# Patient Record
Sex: Male | Born: 1995 | Race: Black or African American | Hispanic: No | Marital: Single | Smoking: Never smoker
Health system: Southern US, Community
[De-identification: ages and names within clinical notes are randomized; demographics above are authoritative.]

## PROBLEM LIST (undated history)

## (undated) DIAGNOSIS — Q6689 Other  specified congenital deformities of feet: Secondary | ICD-10-CM

## (undated) DIAGNOSIS — J3089 Other allergic rhinitis: Secondary | ICD-10-CM

## (undated) DIAGNOSIS — G4733 Obstructive sleep apnea (adult) (pediatric): Secondary | ICD-10-CM

## (undated) DIAGNOSIS — T7840XA Allergy, unspecified, initial encounter: Secondary | ICD-10-CM

## (undated) HISTORY — PX: ADENOIDECTOMY: SUR15

## (undated) HISTORY — DX: Other specified congenital deformities of feet: Q66.89

## (undated) HISTORY — DX: Allergy, unspecified, initial encounter: T78.40XA

## (undated) HISTORY — PX: TONSILLECTOMY: SUR1361

## (undated) HISTORY — DX: Other allergic rhinitis: J30.89

## (undated) HISTORY — DX: Obstructive sleep apnea (adult) (pediatric): G47.33

---
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
## 2015-06-14 ENCOUNTER — Encounter (HOSPITAL_COMMUNITY): Payer: Self-pay | Admitting: Emergency Medicine

## 2015-06-14 ENCOUNTER — Emergency Department (INDEPENDENT_AMBULATORY_CARE_PROVIDER_SITE_OTHER)
Admission: EM | Admit: 2015-06-14 | Discharge: 2015-06-14 | Disposition: A | Discharge status: Home / Self Care | Payer: BLUE CROSS/BLUE SHIELD | Source: Home / Self Care | Attending: Emergency Medicine | Admitting: Emergency Medicine

## 2015-06-14 DIAGNOSIS — M778 Other enthesopathies, not elsewhere classified: Secondary | ICD-10-CM | POA: Diagnosis not present

## 2015-06-14 MED ORDER — DICLOFENAC SODIUM 1 % TD GEL: 2.0000 g | Freq: Four times a day (QID) | TRANSDERMAL | Status: DC

## 2015-06-14 MED ORDER — IBUPROFEN 600 MG PO TABS: 600.0000 mg | ORAL_TABLET | Freq: Four times a day (QID) | ORAL | Status: DC | PRN

## 2015-06-14 NOTE — ED Notes (Signed)
C/o right hand pain onset Thursday Denies inj/trauma; reports he started to work at [employer] x3 weeks A&O x4... No acute distress.

## 2015-06-14 NOTE — ED Provider Notes (Signed)
CSN: [id number]     Arrival date & time 06/14/15  1302 History   First MD Initiated Contact with Patient 06/14/15 1327     Chief Complaint  Patient presents with  . Hand Pain   (Consider location/radiation/quality/duration/timing/severity/associated sxs/prior Treatment) HPI  He is a 19 year old man here for evaluation of right wrist pain. He states he has been having intermittent pain since the beginning of the year. In the last week, it has gotten worse. He reports the pain is in the volar aspect of the wrist. It is worse with wrist flexion. He also reports some discomfort with finger flexion. He states he had some swelling of the hand a few days ago. This has improved. He also reports an episode of numbness in the second and third digits. He did buy a brace on Friday. He states his mom and his sisters all have carpal tunnel. 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 He started this job approximately 4 weeks ago. He also reports some pain in the fifth metatarsal area. He states this is been ongoing since he broke that bone a year ago.  Past Medical History  Diagnosis Date  . Allergy   . Congenital hammer toe     Saw Dr. Lunette StandsAnna Voytek at age 42 yrs. Rec F/u after stops growing  . Perennial allergic rhinitis 12/27/2011    Nasal saline, cetirizine, Topical nasal steroid needed at times for control  . OSA (obstructive sleep apnea)     Rx T and A   Past Surgical History  Procedure Laterality Date  . Tonsillectomy    . Adenoidectomy     Family History  Problem Relation Age of Onset  . Hypertension Mother   . Asthma Mother   . Diabetes Father   . Hyperlipidemia Paternal Grandmother   . Diabetes Paternal Grandmother   . Hyperlipidemia Paternal Grandfather   . Diabetes Paternal Grandfather   . Hypertension Maternal Grandmother   . Hypertension Maternal Grandfather   . Kidney disease Maternal Grandmother     from hypertension  . Kidney disease Maternal Grandfather     from hypertension    Social History  Substance Use Topics  . Smoking status: Never Smoker   . Smokeless tobacco: Never Used  . Alcohol Use: No    Review of Systems As in history of present illness Allergies  Shellfish allergy  Home Medications   Prior to Admission medications   Medication Sig Start Date End Date Taking? Authorizing Provider  diclofenac sodium (VOLTAREN) 1 % GEL Apply 2 g topically 4 (four) times daily. 06/14/15   Charm RingsErin J Honig, MD  EPINEPHrine (EPIPEN) 0.3 mg/0.3 mL DEVI Inject 0.3 mLs (0.3 mg total) into the muscle once. 03/27/12   Preston FleetingJames B Hooker, MD  ibuprofen (ADVIL,MOTRIN) 600 MG tablet Take 1 tablet (600 mg total) by mouth every 6 (six) hours as needed for moderate pain. 06/14/15   Charm RingsErin J Honig, MD   Meds Ordered and Administered this Visit  Medications - No data to display  BP 127/82 mmHg  Pulse 73  Temp(Src) 98.8 F (37.1 C) (Oral)  Resp 16  SpO2 97% No data found.   Physical Exam  Constitutional: He is oriented to person, place, and time. He appears well-developed and well-nourished. No distress.  Cardiovascular: Normal rate.   Pulmonary/Chest: Effort normal.  Musculoskeletal:  Right wrist: No erythema or edema. He has full active range of motion, but with some pain. He is tender over the flexor tendons. He has 5  out of 5 strength. Tinel's causes some pain that moves proximately.  Neurological: He is alert and oriented to person, place, and time.    ED Course  Procedures (including critical care time)  Labs Review Labs Reviewed - No data to display  Imaging Review No results found.   MDM   1. Right wrist tendonitis    He may also have some early carpal tunnel with the numbness in his second and third digits. Recommended continued use of the wrist brace. Voltaren Gel and ibuprofen 4 times a day. Also recommended icing. If not improving in the next 2 weeks, he will follow-up with his orthopedic doctor at [facility].     Charm Rings, MD 06/14/15  1356

## 2015-06-14 NOTE — Discharge Instructions (Signed)
You have tendinitis of your wrist. You may be developing some early carpal tunnel as well. Please wear the brace you bought as often as you can, but particularly at work. 4 times a day: remove the brace, do gentle range of motion, apply ice for 20 minutes, then apply Voltaren gel. You should see improvement over the next 1-2 weeks. [facility] if not improving after 2 weeks.

## 2015-09-11 ENCOUNTER — Encounter (HOSPITAL_COMMUNITY): Payer: Self-pay | Admitting: *Deleted

## 2015-09-11 ENCOUNTER — Emergency Department (INDEPENDENT_AMBULATORY_CARE_PROVIDER_SITE_OTHER)
Admission: EM | Admit: 2015-09-11 | Discharge: 2015-09-11 | Disposition: A | Discharge status: Home / Self Care | Payer: Managed Care, Other (non HMO) | Source: Home / Self Care | Attending: Internal Medicine | Admitting: Internal Medicine

## 2015-09-11 DIAGNOSIS — M778 Other enthesopathies, not elsewhere classified: Secondary | ICD-10-CM

## 2015-09-11 MED ORDER — DICLOFENAC SODIUM 1 % TD GEL
2.0000 g | Freq: Four times a day (QID) | TRANSDERMAL | Status: DC
Start: 2015-09-11 — End: 2016-11-25

## 2015-09-11 MED ORDER — IBUPROFEN 600 MG PO TABS: 600.0000 mg | ORAL_TABLET | Freq: Four times a day (QID) | ORAL | Status: DC | PRN

## 2015-09-11 NOTE — Discharge Instructions (Signed)
Prescriptions for ibuprofen and voltaren (diclofenac) gel were sent to [facility] and [facility]. Ice may be helpful.  If symptoms do not settle down quickly (in a week or so), would consider splinting but do not believe this is necessary at this time. Recheck or followup with pcp/Dr Ramgoolan, for persistent symptoms.

## 2015-09-11 NOTE — ED Provider Notes (Addendum)
CSN: [id number]     Arrival date & time 09/11/15  1646 History   First MD Initiated Contact with Patient 09/11/15 1743     Chief Complaint  Patient presents with  . Wrist Pain   HPI   Patient is a 20 year old gentleman who was seen at the urgent care in November 2016, with right wrist tendinitis. He was treated with ibuprofen and topical Voltaren, and improved. Last evening, he lifted some boxes, and is now having some pain with extension of his wrist. He is worried that symptoms will flare up and be persistent as they were before. No bruising, no report of trauma. No swelling. He has a soft wrist splint at home.  Past Medical History  Diagnosis Date  . Allergy   . Congenital hammer toe     Saw Dr. Lunette Stands at age 63 yrs. Rec F/u after stops growing  . Perennial allergic rhinitis 12/27/2011    Nasal saline, cetirizine, Topical nasal steroid needed at times for control  . OSA (obstructive sleep apnea)     Rx T and A   Past Surgical History  Procedure Laterality Date  . Tonsillectomy    . Adenoidectomy     Family History  Problem Relation Age of Onset  . Hypertension Mother   . Asthma Mother   . Diabetes Father   . Hyperlipidemia Paternal Grandmother   . Diabetes Paternal Grandmother   . Hyperlipidemia Paternal Grandfather   . Diabetes Paternal Grandfather   . Hypertension Maternal Grandmother   . Hypertension Maternal Grandfather   . Kidney disease Maternal Grandmother     from hypertension  . Kidney disease Maternal Grandfather     from hypertension   Social History  Substance Use Topics  . Smoking status: Never Smoker   . Smokeless tobacco: Never Used  . Alcohol Use: No    Review of Systems  All other systems reviewed and are negative.   Allergies  Shellfish allergy  Home Medications   Prior to Admission medications   Medication Sig Start Date End Date Taking? Authorizing Provider  diclofenac sodium (VOLTAREN) 1 % GEL Apply 2 g topically 4 (four) times  daily. 09/11/15   Eustace Moore, MD  EPINEPHrine (EPIPEN) 0.3 mg/0.3 mL DEVI Inject 0.3 mLs (0.3 mg total) into the muscle once. 03/27/12   Preston Fleeting, MD  ibuprofen (ADVIL,MOTRIN) 600 MG tablet Take 1 tablet (600 mg total) by mouth every 6 (six) hours as needed. 09/11/15   Eustace Moore, MD      BP 152/80 mmHg  Pulse 64  Temp(Src) 97.9 F (36.6 C) (Oral)  SpO2 98%   Physical Exam  Constitutional: He is oriented to person, place, and time. No distress.  Alert, nicely groomed  HENT:  Head: Atraumatic.  Eyes:  Conjugate gaze, no eye redness/drainage  Neck: Neck supple.  Cardiovascular: Normal rate.   Pulmonary/Chest: No respiratory distress.  Abdominal: He exhibits no distension.  Musculoskeletal: Normal range of motion.  Fairly symmetric wrist motion, minimal restriction of right wrist extension compared to left. No focal tenderness, no swelling. No bruising/warmth/erythema. Pain with right wrist extension. Able to make a tight fist.  Neurological: He is alert and oriented to person, place, and time.  Skin: Skin is warm and dry.  No cyanosis  Nursing note and vitals reviewed.   ED Course  Procedures (including critical care time)  Cockup splint applied  MDM   1. Tendinitis of right wrist    Meds ordered this  encounter  Medications  . ibuprofen (ADVIL,MOTRIN) 600 MG tablet    Sig: Take 1 tablet (600 mg total) by mouth every 6 (six) hours as needed.    Dispense:  30 tablet    Refill:  0  . diclofenac sodium (VOLTAREN) 1 % GEL    Sig: Apply 2 g topically 4 (four) times daily.    Dispense:  1 Tube    Refill:  0   Apply ice if the wrist is achy at the end of the day. If symptoms are not improving rapidly, splint during activities of the day. Recheck or follow up with PCP/Dr. Ramgoolam for persistent symptoms.      Eustace Moore, MD 09/11/15 1820  Eustace Moore, MD 09/13/15 [id number]

## 2015-09-11 NOTE — ED Notes (Signed)
Pt  Reports  r   Wrist  Pain         Several  Months  Ago     Was  Treated     For  Poss  Tendonitis           And      Reports      He   May    Have  reinjured   It      Yesterday     he  Reports  Pain on  rom

## 2015-10-07 ENCOUNTER — Emergency Department (INDEPENDENT_AMBULATORY_CARE_PROVIDER_SITE_OTHER): Payer: Managed Care, Other (non HMO)

## 2015-10-07 ENCOUNTER — Encounter (HOSPITAL_COMMUNITY): Payer: Self-pay | Admitting: Emergency Medicine

## 2015-10-07 ENCOUNTER — Emergency Department (INDEPENDENT_AMBULATORY_CARE_PROVIDER_SITE_OTHER)
Admission: EM | Admit: 2015-10-07 | Discharge: 2015-10-07 | Disposition: A | Discharge status: Home / Self Care | Payer: Managed Care, Other (non HMO) | Source: Home / Self Care | Attending: Emergency Medicine | Admitting: Emergency Medicine

## 2015-10-07 DIAGNOSIS — S46912A Strain of unspecified muscle, fascia and tendon at shoulder and upper arm level, left arm, initial encounter: Secondary | ICD-10-CM

## 2015-10-07 NOTE — Discharge Instructions (Signed)
Muscle Strain Wear the shoulder sling for approximately 2 days. Do not wear it any longer. Apply ice to the areas of soreness of the shoulder for 2 days than apply heat. Slowly start to move the muscles of the shoulder in 2-3 days. No heavy lifting or pulling for one week. Follow-up with your doctor if pain persists or continued to have other problems. A muscle strain is an injury that occurs when a muscle is stretched beyond its normal length. Usually a small number of muscle fibers are torn when this happens. Muscle strain is rated in degrees. First-degree strains have the least amount of muscle fiber tearing and pain. Second-degree and third-degree strains have increasingly more tearing and pain.  Usually, recovery from muscle strain takes 1-2 weeks. Complete healing takes 5-6 weeks.  CAUSES  Muscle strain happens when a sudden, violent force placed on a muscle stretches it too far. This may occur with lifting, sports, or a fall.  RISK FACTORS Muscle strain is especially common in athletes.  SIGNS AND SYMPTOMS At the site of the muscle strain, there may be:  Pain.  Bruising.  Swelling.  Difficulty using the muscle due to pain or lack of normal function. DIAGNOSIS  Your health care provider will perform a physical exam and ask about your medical history. TREATMENT  Often, the best treatment for a muscle strain is resting, icing, and applying cold compresses to the injured area.  HOME CARE INSTRUCTIONS   Use the PRICE method of treatment to promote muscle healing during the first 2-3 days after your injury. The PRICE method involves:  Protecting the muscle from being injured again.  Restricting your activity and resting the injured body part.  Icing your injury. To do this, put ice in a plastic bag. Place a towel between your skin and the bag. Then, apply the ice and leave it on from 15-20 minutes each hour. After the third day, switch to moist heat packs.  Apply compression to the  injured area with a splint or elastic bandage. Be careful not to wrap it too tightly. This may interfere with blood circulation or increase swelling.  Elevate the injured body part above the level of your heart as often as you can.  Only take over-the-counter or prescription medicines for pain, discomfort, or fever as directed by your health care provider.  Warming up prior to exercise helps to prevent future muscle strains. SEEK MEDICAL CARE IF:   You have increasing pain or swelling in the injured area.  You have numbness, tingling, or a significant loss of strength in the injured area. MAKE SURE YOU:   Understand these instructions.  Will watch your condition.  Will get help right away if you are not doing well or get worse.   This information is not intended to replace advice given to you by your health care provider. Make sure you discuss any questions you have with your health care provider.   Document Released: 07/04/2005 Document Revised: 04/24/2013 Document Reviewed: 01/31/2013 Elsevier Interactive Patient Education [employer]2016 [employer] Inc[employer]

## 2015-10-07 NOTE — ED Provider Notes (Signed)
CSN: [id number]     Arrival date & time 10/07/15  1826 History   First MD Initiated Contact with Patient 10/07/15 2012     Chief Complaint  Patient presents with  . 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 He denies any known injury, fall or blunt trauma. Denies feeling or hearing popping or other abnormal sounds. He states that his shoulder just started to hurt. This occurred yesterday. The soreness increased over the next several hours. He points to the upper left anterior chest/pectoralis muscle as it crosses from the chest to the humerus as well as the trapezius muscle and the left posterior shoulder.  Patient is a 20 y.o. male presenting with shoulder injury.  Shoulder Injury    Past Medical History  Diagnosis Date  . Allergy   . Congenital hammer toe     Saw Dr. Lunette Stands at age 16 yrs. Rec F/u after stops growing  . Perennial allergic rhinitis 12/27/2011    Nasal saline, cetirizine, Topical nasal steroid needed at times for control  . OSA (obstructive sleep apnea)     Rx T and A   Past Surgical History  Procedure Laterality Date  . Tonsillectomy    . Adenoidectomy     Family History  Problem Relation Age of Onset  . Hypertension Mother   . Asthma Mother   . Diabetes Father   . Hyperlipidemia Paternal Grandmother   . Diabetes Paternal Grandmother   . Hyperlipidemia Paternal Grandfather   . Diabetes Paternal Grandfather   . Hypertension Maternal Grandmother   . Hypertension Maternal Grandfather   . Kidney disease Maternal Grandmother     from hypertension  . Kidney disease Maternal Grandfather     from hypertension   Social History  Substance Use Topics  . Smoking status: Never Smoker   . Smokeless tobacco: Never Used  . Alcohol Use: No    Review of Systems   Constitutional: Negative.   Respiratory: Negative.   Musculoskeletal: Positive for myalgias. Negative for back pain, joint swelling, neck pain and neck stiffness.  Skin: Negative.   Neurological: Negative.  Negative for numbness.  All other systems reviewed and are negative.   Allergies  Shellfish allergy  Home Medications   Prior to Admission medications   Medication Sig Start Date End Date Taking? Authorizing Provider  diclofenac sodium (VOLTAREN) 1 % GEL Apply 2 g topically 4 (four) times daily. 09/11/15   Eustace Moore, MD  EPINEPHrine (EPIPEN) 0.3 mg/0.3 mL DEVI Inject 0.3 mLs (0.3 mg total) into the muscle once. 03/27/12   Preston Fleeting, MD  ibuprofen (ADVIL,MOTRIN) 600 MG tablet Take 1 tablet (600 mg total) by mouth every 6 (six) hours as needed. 09/11/15   Eustace Moore, MD   Meds Ordered and Administered this Visit  Medications - No data to display  BP 129/91 mmHg  Pulse 74  Temp(Src) 98.3 F (36.8 C) (Oral)  Resp 20  SpO2 98% No data found.   Physical Exam  Constitutional: He is oriented to person, place, and time. He appears well-developed and well-nourished. No distress.  Neck: Normal range of motion. Neck supple.  Cardiovascular: Normal rate.   Pulmonary/Chest: Effort normal. No respiratory distress.  Musculoskeletal: He exhibits tenderness. He exhibits no edema.  Tenderness over the left upper pectoralis muscle and tendon has a crosses to the humerus. Tenderness over the ridge  of the trapezius muscle, tenderness to the posterior shoulder musculature involving the infraspinatus and teres muscles. Patient is able to abduct 200. Beyond that pain in the posterior shoulder increases. Passive abduction to nearly 180. No swelling, deformity or discoloration. There is posterior joint line swelling over the posterior shoulder musculature as mentioned above. Internal rotation and external rotation produces minor tenderness in the posterior shoulder. Distal neurovascular  motor sensory is intact.  Neurological: He is alert and oriented to person, place, and time. He exhibits normal muscle tone.  Skin: Skin is warm and dry.  Psychiatric: He has a normal mood and affect.  Nursing note and vitals reviewed.   ED Course  Procedures (including critical care time)  Labs Review Labs Reviewed - No data to display  Imaging Review Dg Shoulder Left  10/07/2015  CLINICAL DATA:  Pain in left shoulder after picking up heavy bags. EXAM: LEFT SHOULDER - 2+ VIEW COMPARISON:  None. FINDINGS: There is no evidence of fracture or dislocation. There is no evidence of arthropathy or other focal bone abnormality. Soft tissues are unremarkable. Acromioclavicular and coracoclavicular spaces are maintained. IMPRESSION: No acute bony abnormalities. No evidence of acromioclavicular joint separation. Electronically Signed   By: Burman NievesWilliam  Stevens M.D.   On: 10/07/2015 20:40     Visual Acuity Review  Right Eye Distance:   Left Eye Distance:   Bilateral Distance:    Right Eye Near:   Left Eye Near:    Bilateral Near:         MDM   1. Shoulder strain, left, initial encounter   2. Muscle strain, shoulder region, left, initial encounter    Wear the shoulder sling for approximately 2 days. Do not wear it any longer. Apply ice to the areas of soreness of the shoulder for 2 days than apply heat. Slowly start to move the muscles of the shoulder in 2-3 days. No heavy lifting or pulling for one week. Follow-up with your doctor if pain persists or continued to have other problems. motrn for pain prn    Hayden Rasmussenavid Kiani Wurtzel, NP 10/07/15 2054

## 2015-10-07 NOTE — ED Notes (Signed)
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 These bags fell on a coworker and PT rushed over to remove bags. PT reports he didn't notice at the time, but afterward, he had pain in his left shoulder. PT's ROM is now decreased and PT reports pain when moving affected extremity. This happened yesterday.

## 2016-09-12 DIAGNOSIS — M79641 Pain in right hand: Secondary | ICD-10-CM | POA: Insufficient documentation

## 2016-09-12 DIAGNOSIS — G5621 Lesion of ulnar nerve, right upper limb: Secondary | ICD-10-CM | POA: Insufficient documentation

## 2016-09-12 DIAGNOSIS — S62306P Unspecified fracture of fifth metacarpal bone, right hand, subsequent encounter for fracture with malunion: Secondary | ICD-10-CM | POA: Insufficient documentation

## 2016-09-12 HISTORY — DX: Pain in right hand: M79.641

## 2016-09-12 HISTORY — DX: Lesion of ulnar nerve, right upper limb: G56.21

## 2016-09-12 HISTORY — DX: Unspecified fracture of fifth metacarpal bone, right hand, subsequent encounter for fracture with malunion: S62.306P

## 2016-11-25 ENCOUNTER — Ambulatory Visit (HOSPITAL_COMMUNITY)
Admission: EM | Admit: 2016-11-25 | Discharge: 2016-11-25 | Disposition: A | Discharge status: Home / Self Care | Payer: BLUE CROSS/BLUE SHIELD | Attending: Internal Medicine | Admitting: Internal Medicine

## 2016-11-25 ENCOUNTER — Encounter (HOSPITAL_COMMUNITY): Payer: Self-pay | Admitting: Emergency Medicine

## 2016-11-25 DIAGNOSIS — S39012A Strain of muscle, fascia and tendon of lower back, initial encounter: Secondary | ICD-10-CM | POA: Diagnosis not present

## 2016-11-25 DIAGNOSIS — S29019A Strain of muscle and tendon of unspecified wall of thorax, initial encounter: Secondary | ICD-10-CM

## 2016-11-25 DIAGNOSIS — J301 Allergic rhinitis due to pollen: Secondary | ICD-10-CM

## 2016-11-25 MED ORDER — PREDNISONE 20 MG PO TABS: ORAL_TABLET | ORAL | 0 refills | Status: DC

## 2016-11-25 MED ORDER — MONTELUKAST SODIUM 10 MG PO TABS: 10.0000 mg | ORAL_TABLET | Freq: Every day | ORAL | 0 refills | Status: DC

## 2016-11-25 NOTE — ED Triage Notes (Addendum)
[facility] with a complaint of back pain x 2 weeks. The patient reported that his back started hurting after doing PT in the [employer]. The patient also requested STD testing however has no symptoms. The patient also complained of allergy symptoms that were not being controlled with OTC meds.

## 2016-11-25 NOTE — ED Provider Notes (Signed)
CSN: [id number]     Arrival date & time 11/25/16  1245 History   First MD Initiated Contact with Patient 11/25/16 1458     Chief Complaint  Patient presents with  . Back Pain  . SEXUALLY TRANSMITTED DISEASE  . 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 He said during that time while doing pushups, sit ups and running he developed pain in the right parathoracic musculature and left paralumbar musculature. This was about a week ago. He states mostly stiff and hurts at work. His work is [employer]. He has no radiating pain or radiculopathy. No numbness or focal weakness.  Second complaint is that of allergies. He states he is taking Zyrtec and another OTC medication with Flonase and still having stuffy nose, runny nose and PND sinus congestion. No fevers or chills  States he is here for his monthly check for STDs. He is asymptomatic.      Past Medical History:  Diagnosis Date  . Allergy   . Congenital hammer toe    Saw Dr. Lunette Stands at age 74 yrs. Rec F/u after stops growing  . OSA (obstructive sleep apnea)    Rx T and A  . Perennial allergic rhinitis 12/27/2011   Nasal saline, cetirizine, Topical nasal steroid needed at times for control   Past Surgical History:  Procedure Laterality Date  . ADENOIDECTOMY    . TONSILLECTOMY     Family History  Problem Relation Age of Onset  . Hypertension Mother   . Asthma Mother   . Diabetes Father   . Hyperlipidemia Paternal Grandmother   . Diabetes Paternal Grandmother   . Hyperlipidemia Paternal Grandfather   . Diabetes Paternal Grandfather   . Hypertension Maternal Grandmother   . Hypertension Maternal Grandfather   . Kidney disease Maternal Grandmother        from hypertension  . Kidney disease Maternal Grandfather        from hypertension   Social History  Substance Use Topics  . Smoking status: Never  Smoker  . Smokeless tobacco: Never Used  . Alcohol use No    Review of Systems  Constitutional: Positive for activity change. Negative for diaphoresis, fatigue and fever.  HENT: Positive for congestion, postnasal drip, rhinorrhea and sinus pressure. Negative for ear pain and sore throat.   Eyes: Negative.   Respiratory: Negative.  Negative for cough, chest tightness, shortness of breath and wheezing.   Cardiovascular: Negative for chest pain, palpitations and leg swelling.  Gastrointestinal: Negative.   Genitourinary: Negative.  Negative for dysuria, frequency, penile pain, scrotal swelling and testicular pain.  Musculoskeletal: Positive for back pain and myalgias. Negative for neck pain and neck stiffness.       As per HPI  Skin: Negative.  Negative for color change and rash.  Neurological: Negative.  Negative for dizziness, weakness, numbness and headaches.  Psychiatric/Behavioral: Negative.  Negative for behavioral problems.  All other systems reviewed and are negative.   Allergies  Shellfish allergy  Home Medications   Prior to Admission medications   Medication Sig Start Date End Date Taking? Authorizing Provider  loratadine (CLARITIN) 10 MG tablet Take 10 mg by mouth daily.   Yes [provider]  montelukast (SINGULAIR) 10 MG tablet Take 1 tablet (10 mg total) by mouth at bedtime. 11/25/16   Hayden Rasmussen, NP  predniSONE (DELTASONE) 20 MG tablet Take 3 tabs po on first day, 2 tabs second day, 2 tabs  third day, 1 tab fourth day, 1 tab 5th day. Take with food. 11/25/16   Hayden RasmussenMabe, Ladina Shutters, NP   Meds Ordered and Administered this Visit  Medications - No data to display  BP 133/80 (BP Location: Right Arm)   Pulse 72   Temp 98.4 F (36.9 C) (Oral)   Resp 18   SpO2 99%  No data found.   Physical Exam  Constitutional: He is oriented to person, place, and time. He appears well-developed and well-nourished.  HENT:  Head: Normocephalic and atraumatic.  Mouth/Throat: No  oropharyngeal exudate.  Oropharynx with PND.  Eyes: EOM are normal. Left eye exhibits no discharge.  Neck: Normal range of motion. Neck supple.  Cardiovascular: Normal heart sounds.   Pulmonary/Chest: Effort normal. No respiratory distress.  Musculoskeletal: Normal range of motion. He exhibits no edema or deformity.  No spinal tenderness, deformity, step-off deformity. Tenderness to the right parathoracic musculature and tenderness to the left paralumbar musculature. Patient is able to quickly bend forward well beyond 90 with no pain complaining only of stiffness. He is able to return to standing position with usual amount of effort. Strength and lower extremities 5 over 5 and equal.  Lymphadenopathy:    He has no cervical adenopathy.  Neurological: He is alert and oriented to person, place, and time. No cranial nerve deficit. Coordination normal.  Skin: Skin is warm and dry.  Psychiatric: He has a normal mood and affect. His behavior is normal.  Nursing note and vitals reviewed.   Urgent Care Course     Procedures (including critical care time)  Labs Review Labs Reviewed - No data to display  Imaging Review No results found.   Visual Acuity Review  Right Eye Distance:   Left Eye Distance:   Bilateral Distance:    Right Eye Near:   Left Eye Near:    Bilateral Near:         MDM   1. Strain of lumbar region, initial encounter   2. Thoracic myofascial strain, initial encounter   3. Seasonal allergic rhinitis due to pollen    Apply heat to the area pain in her back. Perform stretches as demonstrated. Limit activities that exacerbate the pain. For worsening, new symptoms or problems follow-up with primary care doctor or may return. Follow-up with health Department for routine STD screening. The lower medications listed that you can take for allergies in addition to the prescription you received. Sudafed PE 10 mg every 4 to 6 hours as needed for congestion Allegra or  Zyrtec daily as needed for drainage and runny nose. For stronger antihistamine may take Chlor-Trimeton 2 to 4 mg every 4 to 6 hours, may cause drowsiness. Saline nasal spray used frequently. Ibuprofen 600 mg every 6 hours as needed for pain, discomfort or fever. Drink plenty of fluids and stay well-hydrated. Flonase or Rhinocort nasal spray daily Meds ordered this encounter  Medications  . loratadine (CLARITIN) 10 MG tablet    Sig: Take 10 mg by mouth daily.  . predniSONE (DELTASONE) 20 MG tablet    Sig: Take 3 tabs po on first day, 2 tabs second day, 2 tabs third day, 1 tab fourth day, 1 tab 5th day. Take with food.    Dispense:  9 tablet    Refill:  0    Order Specific Question:   Supervising Provider    Answer:   Eustace MooreMURRAY, LAURA W [[id number]]  . montelukast (SINGULAIR) 10 MG tablet    Sig: Take 1 tablet (10 mg  total) by mouth at bedtime.    Dispense:  30 tablet    Refill:  0    Order Specific Question:   Supervising Provider    Answer:   Eustace Moore [[id number]]       Hayden Rasmussen, NP 11/25/16 1523

## 2016-11-25 NOTE — Discharge Instructions (Signed)
Apply heat to the area pain in her back. Perform stretches as demonstrated. Limit activities that exacerbate the pain. For worsening, new symptoms or problems follow-up with primary care doctor or may return. Follow-up with health Department for routine STD screening. The lower medications listed that you can take for allergies in addition to the prescription you received. Sudafed PE 10 mg every 4 to 6 hours as needed for congestion Allegra or Zyrtec daily as needed for drainage and runny nose. For stronger antihistamine may take Chlor-Trimeton 2 to 4 mg every 4 to 6 hours, may cause drowsiness. Saline nasal spray used frequently. Ibuprofen 600 mg every 6 hours as needed for pain, discomfort or fever. Drink plenty of fluids and stay well-hydrated. Flonase or Rhinocort nasal spray daily

## 2016-11-27 ENCOUNTER — Encounter (HOSPITAL_COMMUNITY): Payer: Self-pay | Admitting: Emergency Medicine

## 2016-11-27 ENCOUNTER — Emergency Department (HOSPITAL_COMMUNITY)
Admission: EM | Admit: 2016-11-27 | Discharge: 2016-11-27 | Disposition: A | Discharge status: Home / Self Care | Payer: BLUE CROSS/BLUE SHIELD | Attending: Emergency Medicine | Admitting: Emergency Medicine

## 2016-11-27 DIAGNOSIS — R112 Nausea with vomiting, unspecified: Secondary | ICD-10-CM | POA: Diagnosis present

## 2016-11-27 DIAGNOSIS — R197 Diarrhea, unspecified: Secondary | ICD-10-CM | POA: Insufficient documentation

## 2016-11-27 LAB — COMPREHENSIVE METABOLIC PANEL
ALK PHOS: 71 U/L (ref 38–126)
ALT: 20 U/L (ref 17–63)
AST: 21 U/L (ref 15–41)
Albumin: 4.3 g/dL (ref 3.5–5.0)
Anion gap: 8 (ref 5–15)
BUN: 9 mg/dL (ref 6–20)
CALCIUM: 9.3 mg/dL (ref 8.9–10.3)
CO2: 26 mmol/L (ref 22–32)
CREATININE: 1.04 mg/dL (ref 0.61–1.24)
Chloride: 102 mmol/L (ref 101–111)
Glucose, Bld: 105 mg/dL — ABNORMAL HIGH (ref 65–99)
Potassium: 3.5 mmol/L (ref 3.5–5.1)
Sodium: 136 mmol/L (ref 135–145)
Total Bilirubin: 0.8 mg/dL (ref 0.3–1.2)
Total Protein: 7.5 g/dL (ref 6.5–8.1)

## 2016-11-27 LAB — LIPASE, BLOOD: Lipase: 17 U/L (ref 11–51)

## 2016-11-27 LAB — CBC
HCT: 43.6 % (ref 39.0–52.0)
Hemoglobin: 14.9 g/dL (ref 13.0–17.0)
MCH: 29.7 pg (ref 26.0–34.0)
MCHC: 34.2 g/dL (ref 30.0–36.0)
MCV: 87 fL (ref 78.0–100.0)
PLATELETS: 261 10*3/uL (ref 150–400)
RBC: 5.01 MIL/uL (ref 4.22–5.81)
RDW: 12.8 % (ref 11.5–15.5)
WBC: 7.2 10*3/uL (ref 4.0–10.5)

## 2016-11-27 LAB — URINALYSIS, ROUTINE W REFLEX MICROSCOPIC
Bilirubin Urine: NEGATIVE
Glucose, UA: NEGATIVE mg/dL
HGB URINE DIPSTICK: NEGATIVE
Ketones, ur: NEGATIVE mg/dL
LEUKOCYTES UA: NEGATIVE
Nitrite: NEGATIVE
PROTEIN: NEGATIVE mg/dL
Specific Gravity, Urine: 1.026 (ref 1.005–1.030)
pH: 5 (ref 5.0–8.0)

## 2016-11-27 MED ORDER — ONDANSETRON HCL 4 MG PO TABS: 4.0000 mg | ORAL_TABLET | Freq: Four times a day (QID) | ORAL | 0 refills | Status: DC | PRN

## 2016-11-27 MED ORDER — ONDANSETRON 4 MG PO TBDP
ORAL_TABLET | ORAL | Status: AC
  Filled 2016-11-27: qty 1

## 2016-11-27 MED ORDER — ONDANSETRON 4 MG PO TBDP
4.0000 mg | ORAL_TABLET | Freq: Once | ORAL | Status: AC | PRN
  Administered 2016-11-27: 4 mg via ORAL

## 2016-11-27 NOTE — ED Provider Notes (Signed)
[facility]Y DEPT Provider Note   CSN: [id number] Arrival date & time: 11/27/16  0446     History   Chief Complaint Chief Complaint  Patient presents with  . Emesis  . Nausea  . Diarrhea    HPI Joe [name] is a 21 y.o. male.  The history is provided by the patient.  Emesis   This is a new problem. The current episode started 6 to 12 hours ago. The problem occurs 2 to 4 times per day. The problem has not changed since onset.The emesis has an appearance of stomach contents. There has been no fever. Associated symptoms include diarrhea. Pertinent negatives include no cough, no fever and no URI. Risk factors include ill contacts.  Diarrhea   Associated symptoms include vomiting. Pertinent negatives include no URI and no cough.   21 year old male who presents with nausea, vomiting, diarrhea. Onset this morning at midnight. 2 episodes of nonbloody nonbilious vomiting with 2 episodes of nonbloody diarrhea. No abdominal pain just intermittent cramping before vomiting or diarrhea. No dysuria or frequency. No fever or chills. No cough or URI symptoms. No suspiciou food intake. His girlfriend sick with similar symptoms.  Past Medical History:  Diagnosis Date  . Allergy   . Congenital hammer toe    Saw Dr. Lunette Stands at age 60 yrs. Rec F/u after stops growing  . OSA (obstructive sleep apnea)    Rx T and A  . Perennial allergic rhinitis 12/27/2011   Nasal saline, cetirizine, Topical nasal steroid needed at times for control    Patient Active Problem List   Diagnosis Date Noted  . BMI 28.0-28.9,adult 01/01/2014  . Keratosis pilaris 12/28/2011  . Perennial allergic rhinitis 12/27/2011  . Congenital hammer toe   . Allergy to shellfish 08/17/2011    Past Surgical History:  Procedure Laterality Date  . ADENOIDECTOMY    . TONSILLECTOMY         Home Medications    Prior to Admission medications   Medication Sig Start Date End Date Taking? Authorizing Provider    loratadine (CLARITIN) 10 MG tablet Take 10 mg by mouth daily.    [provider]  montelukast (SINGULAIR) 10 MG tablet Take 1 tablet (10 mg total) by mouth at bedtime. 11/25/16   Hayden Rasmussen, NP  predniSONE (DELTASONE) 20 MG tablet Take 3 tabs po on first day, 2 tabs second day, 2 tabs third day, 1 tab fourth day, 1 tab 5th day. Take with food. 11/25/16   Hayden Rasmussen, NP    Family History Family History  Problem Relation Age of Onset  . Hypertension Mother   . Asthma Mother   . Diabetes Father   . Hyperlipidemia Paternal Grandmother   . Diabetes Paternal Grandmother   . Hyperlipidemia Paternal Grandfather   . Diabetes Paternal Grandfather   . Hypertension Maternal Grandmother   . Kidney disease Maternal Grandmother        from hypertension  . Hypertension Maternal Grandfather   . Kidney disease Maternal Grandfather        from hypertension    Social History Social History  Substance Use Topics  . Smoking status: Never Smoker  . Smokeless tobacco: Never Used  . Alcohol use No     Allergies   Shellfish allergy   Review of Systems Review of Systems  Constitutional: Negative for fever.  HENT: Negative for congestion.   Respiratory: Negative for cough.   Cardiovascular: Negative for chest pain.  Gastrointestinal: Positive for diarrhea and  vomiting.  Genitourinary: Negative for dysuria.  Allergic/Immunologic: Negative for immunocompromised state.  Hematological: Does not bruise/bleed easily.  All other systems reviewed and are negative.    Physical Exam Updated Vital Signs BP 135/62 (BP Location: Left Arm)   Pulse 76   Temp 98.1 F (36.7 C) (Oral)   Resp 18   Ht 6\' 3"  (1.905 m)   Wt 250 lb (113.4 kg)   SpO2 96%   BMI 31.25 kg/m   Physical Exam Physical Exam  Nursing note and vitals reviewed. Constitutional:  Non-toxic, and in no acute distress Head: Normocephalic and atraumatic.  Mouth/Throat: Oropharynx is clear and moist.  Neck: Normal range  of motion. Neck supple.  Cardiovascular: Normal rate and regular rhythm.   Pulmonary/Chest: Effort normal and breath sounds normal.  Abdominal: Soft. There is no tenderness. There is no rebound and no guarding.  Musculoskeletal: Normal range of motion.  Neurological: Alert, no facial droop, fluent speech, moves all extremities symmetrically Skin: Skin is warm and dry.  Psychiatric: Cooperative   ED Treatments / Results  Labs (all labs ordered are listed, but only abnormal results are displayed) Labs Reviewed  COMPREHENSIVE METABOLIC PANEL - Abnormal; Notable for the following:       Result Value   Glucose, Bld 105 (*)    All other components within normal limits  LIPASE, BLOOD  CBC  URINALYSIS, ROUTINE W REFLEX MICROSCOPIC    EKG  EKG Interpretation None       Radiology No results found.  Procedures Procedures (including critical care time)  Medications Ordered in ED Medications  ondansetron (ZOFRAN-ODT) 4 MG disintegrating tablet (not administered)  ondansetron (ZOFRAN-ODT) disintegrating tablet 4 mg (4 mg Oral Given 11/27/16 0500)     Initial Impression / Assessment and Plan / ED Course  I have reviewed the triage vital signs and the nursing notes.  Pertinent labs & imaging results that were available during my care of the patient were reviewed by me and considered in my medical decision making (see chart for details).     Presenting with 1 day of nausea, vomiting, and diarrhea. Presentation seems consistent with that of a benign GI illness, such as food borne illness versus gastroenteritis. He is normal vital signs. Has a soft and benign abdomen. Blood work reviewed and shows no major electrolyte or metabolic derangements. Low suspicion for serious intra-abdominal pathology. I discussed continued supportive care management for this. With Zofran he has been able to tolerate by mouth intake without difficulty. We'll discharge home with a course of Zofran. Strict  return and follow-up instructions reviewed. He expressed understanding of all discharge instructions and felt comfortable with the plan of care.   Final Clinical Impressions(s) / ED Diagnoses   Final diagnoses:  Nausea vomiting and diarrhea    New Prescriptions New Prescriptions   No medications on file     Lavera GuiseLiu, Sabree Nuon Duo, MD 11/27/16 [phone]

## 2016-11-27 NOTE — Discharge Instructions (Signed)
Take nausea medications as prescribed.  Return for worsening symptoms, including intractable vomiting, escalating pain, confusions, fevers, or any other symptoms concerning to you.

## 2016-11-27 NOTE — ED Triage Notes (Signed)
Patient here with n/v/d since midnight.  He states that he is having some abdominal pain with the other symptoms.  Patient states that he had the "bubbly gut" feeling and then just started vomiting.  He states that he feels better after vomiting.

## 2017-01-02 ENCOUNTER — Encounter (HOSPITAL_COMMUNITY): Payer: Self-pay

## 2017-01-02 ENCOUNTER — Emergency Department (HOSPITAL_COMMUNITY)
Admission: EM | Admit: 2017-01-02 | Discharge: 2017-01-02 | Disposition: A | Discharge status: Home / Self Care | Payer: BLUE CROSS/BLUE SHIELD | Attending: Emergency Medicine | Admitting: Emergency Medicine

## 2017-01-02 DIAGNOSIS — L259 Unspecified contact dermatitis, unspecified cause: Secondary | ICD-10-CM | POA: Diagnosis not present

## 2017-01-02 DIAGNOSIS — Z79899 Other long term (current) drug therapy: Secondary | ICD-10-CM | POA: Insufficient documentation

## 2017-01-02 DIAGNOSIS — R21 Rash and other nonspecific skin eruption: Secondary | ICD-10-CM | POA: Diagnosis present

## 2017-01-02 MED ORDER — TRIAMCINOLONE ACETONIDE 0.1 % EX OINT: 1.0000 "application " | TOPICAL_OINTMENT | Freq: Two times a day (BID) | CUTANEOUS | 0 refills | Status: DC

## 2017-01-02 NOTE — ED Triage Notes (Signed)
Pt reports rash scattered in various places on his body, such as head, arms, hands, groin. He noticed it last Friday but was unable to get it checked out until today. No SOB, no hives

## 2017-01-02 NOTE — ED Provider Notes (Signed)
[facility] Provider Note   CSN: [id number] Arrival date & time: 01/02/17  1633  By signing my name below, I, Modena JanskyAlbert Thayil, attest that this documentation has been prepared under the direction and in the presence of Rolan BuccoBelfi, Devetta Hagenow, MD. Electronically Signed: Modena JanskyAlbert Thayil, Scribe. 01/02/2017. 5:49 PM.  History   Chief Complaint Chief Complaint  Patient presents with  . Rash   The history is provided by the patient. No language interpreter was used.   HPI Comments: Joe [name] is a 21 y.o. male who presents to the Emergency Department complaining of constant moderate RUE rash that started about a week ago. He states his rash started in his RUE and spread to his LUE and head. He describes the rash as bumpy. He thinks it started after he got 2 mosquito bites to the area.  Denies any recent poison ivy exposure or other complaints at this time.  Past Medical History:  Diagnosis Date  . Allergy   . Congenital hammer toe    Saw Dr. Lunette StandsAnna Voytek at age 46 yrs. Rec F/u after stops growing  . OSA (obstructive sleep apnea)    Rx T and A  . Perennial allergic rhinitis 12/27/2011   Nasal saline, cetirizine, Topical nasal steroid needed at times for control    Patient Active Problem List   Diagnosis Date Noted  . BMI 28.0-28.9,adult 01/01/2014  . Keratosis pilaris 12/28/2011  . Perennial allergic rhinitis 12/27/2011  . Congenital hammer toe   . Allergy to shellfish 08/17/2011    Past Surgical History:  Procedure Laterality Date  . ADENOIDECTOMY    . TONSILLECTOMY         Home Medications    Prior to Admission medications   Medication Sig Start Date End Date Taking? Authorizing Provider  loratadine (CLARITIN) 10 MG tablet Take 10 mg by mouth daily.    [provider]  montelukast (SINGULAIR) 10 MG tablet Take 1 tablet (10 mg total) by mouth at bedtime. 11/25/16   Hayden RasmussenMabe, David, NP  ondansetron (ZOFRAN) 4 MG tablet Take 1 tablet (4 mg total) by mouth every 6  (six) hours as needed for nausea or vomiting. 11/27/16   Lavera GuiseLiu, Dana Duo, MD  predniSONE (DELTASONE) 20 MG tablet Take 3 tabs po on first day, 2 tabs second day, 2 tabs third day, 1 tab fourth day, 1 tab 5th day. Take with food. 11/25/16   Hayden RasmussenMabe, David, NP  triamcinolone ointment (KENALOG) 0.1 % Apply 1 application topically 2 (two) times daily. 01/02/17   Rolan BuccoBelfi, Briante Loveall, MD    Family History Family History  Problem Relation Age of Onset  . Hypertension Mother   . Asthma Mother   . Diabetes Father   . Hyperlipidemia Paternal Grandmother   . Diabetes Paternal Grandmother   . Hyperlipidemia Paternal Grandfather   . Diabetes Paternal Grandfather   . Hypertension Maternal Grandmother   . Kidney disease Maternal Grandmother        from hypertension  . Hypertension Maternal Grandfather   . Kidney disease Maternal Grandfather        from hypertension    Social History Social History  Substance Use Topics  . Smoking status: Never Smoker  . Smokeless tobacco: Never Used  . Alcohol use No     Allergies   Shellfish allergy   Review of Systems Review of Systems  Constitutional: Negative for fever.  Gastrointestinal: Negative for nausea and vomiting.  Musculoskeletal: Negative for back pain and neck pain.  Skin: Positive for  rash. Negative for wound.  Neurological: Negative for weakness, numbness and headaches.     Physical Exam Updated Vital Signs BP (!) 134/91   Pulse 65   Temp 98.3 F (36.8 C) (Oral)   Resp 18   Ht 6\' 3"  (1.905 m)   Wt 250 lb (113.4 kg)   SpO2 100%   BMI 31.25 kg/m   Physical Exam  Constitutional: He is oriented to person, place, and time. He appears well-developed and well-nourished.  HENT:  Head: Normocephalic and atraumatic.  Neck: Normal range of motion. Neck supple.  Cardiovascular: Normal rate.   Pulmonary/Chest: Effort normal.  Musculoskeletal: He exhibits no edema.  Neurological: He is alert and oriented to person, place, and time.  Skin:  Skin is warm and dry. Rash noted.  Patient has a raised erythematous rash to the flexor crease of his right elbow. There are some small vesicles present. There is also some small patches to his right forearm and his left fingers. There are no papules. No overlying signs of infection. No petechiae or purpura.  Psychiatric: He has a normal mood and affect.     ED Treatments / Results  DIAGNOSTIC STUDIES: Oxygen Saturation is 100% on RA, normal by my interpretation.    COORDINATION OF CARE: 5:53 PM- Pt advised of plan for treatment and pt agrees.  Labs (all labs ordered are listed, but only abnormal results are displayed) Labs Reviewed - No data to display  EKG  EKG Interpretation None       Radiology No results found.  Procedures Procedures (including critical care time)  Medications Ordered in ED Medications - No data to display   Initial Impression / Assessment and Plan / ED Course  I have reviewed the triage vital signs and the nursing notes.  Pertinent labs & imaging results that were available during my care of the patient were reviewed by me and considered in my medical decision making (see chart for details).     Patient had a rash to his upper extremities. It looks to be consistent with poison ivy although he denies being exposed to poison ivy. It has the raise, fine vesicles that typically go along with poison ivy. He could have a contact dermatitis from another exposure. I will prescribe him triamcinolone cream. The rest does not appear to be infected. There is no symptoms that would be more concerning for scabies. He was discharged home in good condition. He was given a referral to follow-up with dermatology if his symptoms are not improving.  Final Clinical Impressions(s) / ED Diagnoses   Final diagnoses:  Contact dermatitis, unspecified contact dermatitis type, unspecified trigger    New Prescriptions New Prescriptions   TRIAMCINOLONE OINTMENT (KENALOG)  0.1 %    Apply 1 application topically 2 (two) times daily.   I personally performed the services described in this documentation, which was scribed in my presence.  The recorded information has been reviewed and considered.     Rolan Bucco, MD 01/02/17 [phone]

## 2017-01-05 ENCOUNTER — Ambulatory Visit (HOSPITAL_COMMUNITY)
Admission: EM | Admit: 2017-01-05 | Discharge: 2017-01-05 | Disposition: A | Discharge status: Home / Self Care | Payer: BLUE CROSS/BLUE SHIELD | Attending: Family Medicine | Admitting: Family Medicine

## 2017-01-05 ENCOUNTER — Encounter (HOSPITAL_COMMUNITY): Payer: Self-pay | Admitting: Emergency Medicine

## 2017-01-05 DIAGNOSIS — B0089 Other herpesviral infection: Secondary | ICD-10-CM

## 2017-01-05 DIAGNOSIS — L259 Unspecified contact dermatitis, unspecified cause: Secondary | ICD-10-CM | POA: Diagnosis not present

## 2017-01-05 MED ORDER — VALACYCLOVIR HCL 1 G PO TABS: 1000.0000 mg | ORAL_TABLET | Freq: Three times a day (TID) | ORAL | 0 refills | Status: AC

## 2017-01-05 MED ORDER — PREDNISONE 10 MG (21) PO TBPK: ORAL_TABLET | ORAL | 0 refills | Status: DC

## 2017-01-05 NOTE — Discharge Instructions (Signed)
Rash is contagious, I recommend he keep your hands clean, dry, wash your hands frequently. I prescribed Valtrex, take one tablet twice a day. If your rash persists, you may need to follow up with dermatology. For your dermatitis, prescribed oral steroids, continue the triamcinolone ointment twice daily, and then take the oral steroids as directed.

## 2017-01-05 NOTE — ED Triage Notes (Addendum)
Onset Sunday of rash.  Initially on right ear, since then rash has spread all over his body and hands.  Rash sometimes itches and sometimes it burns.  Patient thinks rash related to work, but has had this job for 2 years and this only recently started.

## 2017-01-05 NOTE — ED Provider Notes (Signed)
CSN: [id number]     Arrival date & time 01/05/17  1527 History   First MD Initiated Contact with Patient 01/05/17 1604     Chief Complaint  Patient presents with  . Rash   (Consider location/radiation/quality/duration/timing/severity/associated sxs/prior Treatment) Joe [name] is a 21 y.o. male with a past history of allergic rhinitis, who [facility] with a chief complaint of to the right elbow. He was seen and evaluated in the ER on 01/02/2017, diagnosed with contact dermatitis, and started on triamcinolone. He is complaining of a new rash on his hands, states it's painful, blistering, and tingling. This rash is been present for approximately 2 days. He denies any nausea, vomiting, fever, malaise, or other systemic symptoms.    Rash  Location:  Hand Hand rash location:  L hand, R hand, L fingers and R fingers Quality: blistering, painful and redness   Pain details:    Quality:  Tingling and burning   Severity:  Moderate   Onset quality:  Gradual   Duration:  2 days   Timing:  Constant   Progression:  Unchanged Severity:  Moderate Onset quality:  Gradual Duration:  2 days Timing:  Constant Progression:  Unchanged Chronicity:  New Context: not animal contact, not food, not medications, not new detergent/soap, not plant contact and not sick contacts   Relieved by:  Nothing Worsened by:  Nothing Ineffective treatments:  Topical steroids Associated symptoms: no abdominal pain, no fatigue, no fever, no headaches, no nausea, no URI, not vomiting and not wheezing     Past Medical History:  Diagnosis Date  . Allergy   . Congenital hammer toe    Saw Dr. Lunette StandsAnna Voytek at age 89 yrs. Rec F/u after stops growing  . OSA (obstructive sleep apnea)    Rx T and A  . Perennial allergic rhinitis 12/27/2011   Nasal saline, cetirizine, Topical nasal steroid needed at times for control   Past Surgical History:  Procedure Laterality Date  . ADENOIDECTOMY    .  TONSILLECTOMY     Family History  Problem Relation Age of Onset  . Hypertension Mother   . Asthma Mother   . Diabetes Father   . Hyperlipidemia Paternal Grandmother   . Diabetes Paternal Grandmother   . Hyperlipidemia Paternal Grandfather   . Diabetes Paternal Grandfather   . Hypertension Maternal Grandmother   . Kidney disease Maternal Grandmother        from hypertension  . Hypertension Maternal Grandfather   . Kidney disease Maternal Grandfather        from hypertension   Social History  Substance Use Topics  . Smoking status: Never Smoker  . Smokeless tobacco: Never Used  . Alcohol use No    Review of Systems  Constitutional: Negative for fatigue and fever.  HENT: Negative.   Respiratory: Negative for cough and wheezing.   Cardiovascular: Negative for chest pain and palpitations.  Gastrointestinal: Negative for abdominal pain, nausea and vomiting.  Musculoskeletal: Negative.   Skin: Positive for rash.  Neurological: Negative for light-headedness and headaches.    Allergies  Shellfish allergy  Home Medications   Prior to Admission medications   Medication Sig Start Date End Date Taking? Authorizing Provider  triamcinolone ointment (KENALOG) 0.1 % Apply 1 application topically 2 (two) times daily. 01/02/17  Yes Rolan BuccoBelfi, Melanie, MD  predniSONE (STERAPRED UNI-PAK 21 TAB) 10 MG (21) TBPK tablet Take 6 tablets tomorrow, decrease by 1 each day till finished (6,5,4,3,2,1) 01/05/17  Dorena Bodo, NP  valACYclovir (VALTREX) 1000 MG tablet Take 1 tablet (1,000 mg total) by mouth 3 (three) times daily. 01/05/17 01/19/17  Dorena Bodo, NP   Meds Ordered and Administered this Visit  Medications - No data to display  BP (!) 141/72 (BP Location: Left Arm)   Pulse 83   Temp 97.8 F (36.6 C) (Oral)   Resp 18   SpO2 97%  No data found.   Physical Exam  Constitutional: He is oriented to person, place, and time. He appears well-developed and well-nourished. No  distress.  HENT:  Head: Normocephalic and atraumatic.  Right Ear: External ear normal.  Left Ear: External ear normal.  Eyes: Conjunctivae are normal.  Neck: Normal range of motion.  Neurological: He is alert and oriented to person, place, and time.  Skin: Skin is warm and dry. Capillary refill takes less than 2 seconds. Rash noted. He is not diaphoretic.  On both hands, multiple vesicular, erythemic, pustular lesions. And raised, erythemic, patch in the right antecubital area  Psychiatric: He has a normal mood and affect. His behavior is normal.  Nursing note and vitals reviewed.   Urgent Care Course     Procedures (including critical care time)  Labs Review Labs Reviewed - No data to display  Imaging Review No results found.     MDM   1. Contact dermatitis, unspecified contact dermatitis type, unspecified trigger   2. Herpetic whitlow     Joe [name] is a 21 y.o. male with a past history of allergic rhinitis, who presents [facility] with a chief complaint of to the right elbow. He was seen and evaluated in the ER on 01/02/2017, diagnosed with contact dermatitis, and started on triamcinolone. He is complaining of a new rash on his hands, states it's painful, blistering, and tingling. This rash is been present for approximately 2 days. He denies any nausea, vomiting, fever, malaise, or other systemic symptoms.   Sound Valtrex for herpetic whitlow,, and prednisone for the contact dermatitis. Continue the triamcinolone ointment for the contact dermatitis as well. Work note provided, follow-up as needed if symptoms persist    Dorena Bodo, NP 01/05/17 1640

## 2017-03-10 ENCOUNTER — Ambulatory Visit (HOSPITAL_COMMUNITY)
Admission: EM | Admit: 2017-03-10 | Discharge: 2017-03-10 | Disposition: A | Discharge status: Home / Self Care | Payer: BLUE CROSS/BLUE SHIELD | Attending: Family | Admitting: Family

## 2017-03-10 ENCOUNTER — Encounter (HOSPITAL_COMMUNITY): Payer: Self-pay | Admitting: Emergency Medicine

## 2017-03-10 DIAGNOSIS — M62830 Muscle spasm of back: Secondary | ICD-10-CM | POA: Diagnosis not present

## 2017-03-10 MED ORDER — CYCLOBENZAPRINE HCL 5 MG PO TABS: 5.0000 mg | ORAL_TABLET | Freq: Every evening | ORAL | 0 refills | Status: DC | PRN

## 2017-03-10 MED ORDER — IBUPROFEN 800 MG PO TABS: 800.0000 mg | ORAL_TABLET | Freq: Three times a day (TID) | ORAL | 0 refills | Status: DC | PRN

## 2017-03-10 NOTE — ED Triage Notes (Signed)
Pt reports pain in his left shoulder blade that shoots to the middle of his back.  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

## 2017-03-10 NOTE — ED Provider Notes (Signed)
[facility]    CSN: [id number] Arrival date & time: 03/10/17  1251     History   Chief Complaint Chief Complaint  Patient presents with  . Back Pain    HPI Joe [name] is a 21 y.o. male.   Chief complaint of upper back pain, unchanged x 3 days. Also has pain in left shoulder over scapula.   States he works for [employer] and lifts heavy boxes. Reported  Lifting 120 pound package which bounced off and hit back. No popping sounds heard in shoulder.  No head injury, no loc.  Pain associated with movement and lifting  Heaving objects. No relief with tyleonol, advil. Heat helps some however temporary.  No numbness, tingling, chest pain, rash.   Drinks alcohol on occasion when social 'not during week.' Moderate use.       Past Medical History:  Diagnosis Date  . Allergy   . Congenital hammer toe    Saw Dr. Lunette Stands at age 22 yrs. Rec F/u after stops growing  . OSA (obstructive sleep apnea)    Rx T and A  . Perennial allergic rhinitis 12/27/2011   Nasal saline, cetirizine, Topical nasal steroid needed at times for control    Patient Active Problem List   Diagnosis Date Noted  . BMI 28.0-28.9,adult 01/01/2014  . Keratosis pilaris 12/28/2011  . Perennial allergic rhinitis 12/27/2011  . Congenital hammer toe   . Allergy to shellfish 08/17/2011    Past Surgical History:  Procedure Laterality Date  . ADENOIDECTOMY    . TONSILLECTOMY         Home Medications    Prior to Admission medications   Medication Sig Start Date End Date Taking? Authorizing Provider  cyclobenzaprine (FLEXERIL) 5 MG tablet Take 1 tablet (5 mg total) by mouth at bedtime as needed for muscle spasms. 03/10/17   Allegra Grana, FNP  ibuprofen (ADVIL,MOTRIN) 800 MG tablet Take 1 tablet (800 mg total) by mouth 3 (three) times daily with meals as needed. For pain 03/10/17   Allegra Grana, FNP  predniSONE (STERAPRED UNI-PAK 21 TAB) 10 MG (21) TBPK tablet Take 6 tablets tomorrow,  decrease by 1 each day till finished (6,5,4,3,2,1) 01/05/17   Dorena Bodo, NP  triamcinolone ointment (KENALOG) 0.1 % Apply 1 application topically 2 (two) times daily. 01/02/17   Rolan Bucco, MD    Family History Family History  Problem Relation Age of Onset  . Hypertension Mother   . Asthma Mother   . Diabetes Father   . Hyperlipidemia Paternal Grandmother   . Diabetes Paternal Grandmother   . Hyperlipidemia Paternal Grandfather   . Diabetes Paternal Grandfather   . Hypertension Maternal Grandmother   . Kidney disease Maternal Grandmother        from hypertension  . Hypertension Maternal Grandfather   . Kidney disease Maternal Grandfather        from hypertension    Social History Social History  Substance Use Topics  . Smoking status: Never Smoker  . Smokeless tobacco: Never Used  . Alcohol use No     Allergies   Shellfish allergy   Review of Systems Review of Systems  Constitutional: Negative for chills and fever.  Eyes: Negative for visual disturbance.  Respiratory: Negative for cough.   Cardiovascular: Negative for chest pain and palpitations.  Gastrointestinal: Negative for nausea and vomiting.  Musculoskeletal: Positive for back pain. Negative for joint swelling, neck pain and neck stiffness.  Skin: Negative for rash.  Neurological:  Negative for dizziness, numbness and headaches.     Physical Exam Triage Vital Signs ED Triage Vitals  Enc Vitals Group     BP 03/10/17 1319 123/73     Pulse Rate 03/10/17 1319 65     Resp --      Temp 03/10/17 1319 98.4 F (36.9 C)     Temp Source 03/10/17 1319 Oral     SpO2 03/10/17 1319 99 %     Weight --      Height --      Head Circumference --      Peak Flow --      Pain Score 03/10/17 1318 3     Pain Loc --      Pain Edu? --      Excl. in GC? --    No data found.   Updated Vital Signs BP 123/73 (BP Location: Left Arm)   Pulse 65   Temp 98.4 F (36.9 C) (Oral)   SpO2 99%   Visual  Acuity Right Eye Distance:   Left Eye Distance:   Bilateral Distance:    Right Eye Near:   Left Eye Near:    Bilateral Near:     Physical Exam  Constitutional: He appears well-developed and well-nourished.  Cardiovascular: Regular rhythm and normal heart sounds.   Pulmonary/Chest: Effort normal and breath sounds normal. No respiratory distress. He has no wheezes. He has no rhonchi. He has no rales.  Musculoskeletal:       Cervical back: Normal. He exhibits normal range of motion, no tenderness, no bony tenderness, no swelling and no edema.       Thoracic back: He exhibits tenderness, pain and spasm. He exhibits normal range of motion, no bony tenderness, no swelling, no edema and no deformity.       Back:       Arms: Pain as described by patient as marked on diagram. No ecchymosis, swelling, erythema, laceration, rash appreciated.  LEFT Shoulder:   No asymmetry of shoulders when comparing right and left.No pain with palpation over glenohumeral joint lines, [place] joint, AC joint, or bicipital groove. No pain with internal and external rotation. No pain with resisted lateral extension .   Negative active painful arc sign. Negative passive arc ( Neer's). Negative drop arm.  No pain, swelling, or ecchymosis noted over long head of biceps.     Strength and sensation normal BUE's.   Lymphadenopathy:       Head (left side): No submandibular and no preauricular adenopathy present.  Neurological: He is alert.  Skin: Skin is warm and dry.  Psychiatric: He has a normal mood and affect. His speech is normal and behavior is normal.  Vitals reviewed.    UC Treatments / Results  Labs (all labs ordered are listed, but only abnormal results are displayed) Labs Reviewed - No data to display  EKG  EKG Interpretation None       Radiology No results found.  Procedures Procedures (including critical care time)  Medications Ordered in UC Medications - No data to display   Initial  Impression / Assessment and Plan / UC Course  I have reviewed the triage vital signs and the nursing notes.  Pertinent labs & imaging results that were available during my care of the patient were reviewed by me and considered in my medical decision making (see chart for details).       Final Clinical Impressions(s) / UC Diagnoses   Final diagnoses:  Muscle spasm of  back  Symptoms consistent with musculoskeletal spasm, strain from lifting heavy box at work. Advised conservative therapy at this time with heat, prescription strength ibuprofen, Flexeril as needed. Counseled patient on not using Flexeril with any alcohol or when operating machinery. He verbalized understanding. Return precautions given.  New Prescriptions Discharge Medication List as of 03/10/2017  2:21 PM    START taking these medications   Details  cyclobenzaprine (FLEXERIL) 5 MG tablet Take 1 tablet (5 mg total) by mouth at bedtime as needed for muscle spasms., Starting Fri 03/10/2017, Normal    ibuprofen (ADVIL,MOTRIN) 800 MG tablet Take 1 tablet (800 mg total) by mouth 3 (three) times daily with meals as needed. For pain, Starting Fri 03/10/2017, Normal         Controlled Substance Prescriptions [place] Controlled Substance Registry consulted? Not Applicable   Allegra Grana, FNP 03/10/17 [phone]

## 2017-03-10 NOTE — Discharge Instructions (Signed)
May use Flexeril as needed at bedtime. Do not drive or operate heavy machinery while on muscle relaxant. Please do not drink alcohol. Only take this medication as needed for acute muscle spasm at bedtime. This medication make you feel drowsy so be very careful.  Stop taking if become too drowsy or somnolent as this puts you at risk for falls. Please contact our office with any questions.  Couple days worth of prescription strength ibuprofen. Be sure to take this with food. May stop tylenol, advil, aleve etc while on ibuprofen 800mg .  Gentle stretching, heat  If there is no improvement in your symptoms, or if there is any worsening of symptoms, or if you have any additional concerns, please return for re-evaluation; or, if we are closed, consider going to the Emergency Room for evaluation if symptoms urgent.

## 2017-03-24 ENCOUNTER — Ambulatory Visit (INDEPENDENT_AMBULATORY_CARE_PROVIDER_SITE_OTHER): Payer: BLUE CROSS/BLUE SHIELD

## 2017-03-24 ENCOUNTER — Encounter (HOSPITAL_COMMUNITY): Payer: Self-pay | Admitting: Family Medicine

## 2017-03-24 ENCOUNTER — Ambulatory Visit (HOSPITAL_COMMUNITY)
Admission: EM | Admit: 2017-03-24 | Discharge: 2017-03-24 | Disposition: A | Discharge status: Home / Self Care | Payer: BLUE CROSS/BLUE SHIELD | Attending: Family | Admitting: Family

## 2017-03-24 DIAGNOSIS — M545 Low back pain: Secondary | ICD-10-CM

## 2017-03-24 NOTE — ED Provider Notes (Signed)
[facility]    CSN: [id number] Arrival date & time: 03/24/17  1431     History   Chief Complaint Chief Complaint  Patient presents with  . Back Pain    HPI Joe [name] is a 21 y.o. male.   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  Endorses numbness midback and goes to bilateral ankles. No falls or re-injury. No urinary incontinence,h/o cancer.    Back pain worse with running and sit ups.   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 Works with [employer]. 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 Goes tomorrow through Sunday for total of 3 days.   Otilio [name] advised him that he needs a note of what he cannot and can do.           Past Medical History:  Diagnosis Date  . Allergy   . Congenital hammer toe    Saw Dr. Lunette StandsAnna [name] at age 48 yrs. Rec F/Joe after stops growing  . OSA (obstructive sleep apnea)    Rx T and A  . Perennial allergic rhinitis 12/27/2011   Nasal saline, cetirizine, Topical nasal steroid needed at times for control    Patient Active Problem List   Diagnosis Date Noted  . BMI 28.0-28.9,adult 01/01/2014  . Keratosis pilaris 12/28/2011  . Perennial allergic rhinitis 12/27/2011  . Congenital hammer toe   . Allergy to shellfish 08/17/2011    Past Surgical History:  Procedure Laterality Date  . ADENOIDECTOMY    . TONSILLECTOMY         Home Medications    Prior to Admission medications   Medication Sig Start Date End Date Taking? Authorizing Provider  cyclobenzaprine (FLEXERIL) 5 MG tablet Take 1 tablet (5 mg total) by mouth at bedtime as needed for muscle spasms. 03/10/17   Allegra GranaArnett, Margaret G, FNP  ibuprofen (ADVIL,MOTRIN) 800 MG tablet Take 1 tablet (800 mg total) by mouth 3 (three) times daily with meals as needed. For pain 03/10/17   Allegra GranaArnett, Margaret G, FNP  predniSONE (STERAPRED UNI-PAK 21 TAB) 10 MG  (21) TBPK tablet Take 6 tablets tomorrow, decrease by 1 each day till finished (6,5,4,3,2,1) 01/05/17   Dorena BodoKennard, Lawrence, NP  triamcinolone ointment (KENALOG) 0.1 % Apply 1 application topically 2 (two) times daily. 01/02/17   Rolan BuccoBelfi, Melanie, MD    Family History Family History  Problem Relation Age of Onset  . Hypertension Mother   . Asthma Mother   . Diabetes Father   . Hyperlipidemia Paternal Grandmother   . Diabetes Paternal Grandmother   . Hyperlipidemia Paternal Grandfather   . Diabetes Paternal Grandfather   . Hypertension Maternal Grandmother   . Kidney disease Maternal Grandmother        from hypertension  . Hypertension Maternal Grandfather   . Kidney disease Maternal Grandfather        from hypertension    Social History Social History  Substance Use Topics  . Smoking status: Never Smoker  . Smokeless tobacco: Never Used  . Alcohol use No     Allergies   Shellfish allergy   Review of Systems Review of Systems  Constitutional: Negative for chills and fever.  Respiratory: Negative for cough.   Cardiovascular: Negative for chest pain and palpitations.  Gastrointestinal: Negative for nausea and vomiting.  Genitourinary: Negative for dysuria.  Musculoskeletal: Positive for back pain.  Neurological:  Positive for numbness.     Physical Exam Triage Vital Signs ED Triage Vitals  Enc Vitals Group     BP 03/24/17 1459 (!) 142/89     Pulse Rate 03/24/17 1459 72     Resp 03/24/17 1459 18     Temp 03/24/17 1459 98.7 F (37.1 C)     Temp src --      SpO2 03/24/17 1459 100 %     Weight --      Height --      Head Circumference --      Peak Flow --      Pain Score 03/24/17 1458 4     Pain Loc --      Pain Edu? --      Excl. in GC? --    No data found.   Updated Vital Signs BP 125/75   Pulse 72   Temp 98.7 F (37.1 C)   Resp 18   SpO2 100%   Visual Acuity Right Eye Distance:   Left Eye Distance:   Bilateral Distance:    Right Eye Near:     Left Eye Near:    Bilateral Near:     Physical Exam  Constitutional: He appears well-developed and well-nourished.  Cardiovascular: Regular rhythm and normal heart sounds.   Pulmonary/Chest: Effort normal and breath sounds normal. No respiratory distress. He has no wheezes. He has no rhonchi. He has no rales.  Musculoskeletal:       Thoracic back: He exhibits pain. He exhibits normal range of motion, no tenderness, no bony tenderness, no swelling, no edema and no spasm.       Lumbar back: He exhibits normal range of motion, no tenderness, no swelling, no pain and no spasm.       Back:  Area of pain as reported by patient.   Full range of motion with flexion, extension, lateral side bends. No pain, numbness, tingling elicited with single leg raise bilaterally. No rash.  Lymphadenopathy:       Head (left side): No submandibular and no preauricular adenopathy present.  Neurological: He is alert.  Skin: Skin is warm and dry.  Psychiatric: He has a normal mood and affect. His speech is normal and behavior is normal.  Vitals reviewed.    UC Treatments / Results  Labs (all labs ordered are listed, but only abnormal results are displayed) Labs Reviewed - No data to display  EKG  EKG Interpretation None       Radiology Dg Thoracic Spine 2 View  Result Date: 03/24/2017 CLINICAL DATA:  Hit with heavy box in back approximately 2 weeks ago. Persistent back pain. EXAM: THORACIC SPINE 2 VIEWS COMPARISON:  None. FINDINGS: There is no evidence of thoracic spine fracture. Alignment is normal. No other significant bone abnormalities are identified. IMPRESSION: Negative. Electronically Signed   By: Myles Rosenthal M.D.   On: 03/24/2017 16:37   Dg Lumbar Spine Complete  Result Date: 03/24/2017 CLINICAL DATA:  Hit with heavy box in back approximately 2 weeks ago. Persistent back pain. Initial encounter. EXAM: LUMBAR SPINE - COMPLETE 4+ VIEW COMPARISON:  None. FINDINGS: There is no evidence of lumbar  spine fracture. Alignment is normal. Intervertebral disc spaces are maintained. No other osseous abnormality identified. IMPRESSION: Negative. Electronically Signed   By: Myles Rosenthal M.D.   On: 03/24/2017 16:37    Procedures Procedures (including critical care time)  Medications Ordered in UC Medications - No data to display   Initial Impression /  Assessment and Plan / UC Course  I have reviewed the triage vital signs and the nursing notes.  Pertinent labs & imaging results that were available during my care of the patient were reviewed by me and considered in my medical decision making (see chart for details).      Final Clinical Impressions(s) / UC Diagnoses   Final diagnoses:  Low back pain, unspecified back pain laterality, unspecified chronicity, with sciatica presence unspecified  Unchanged. Suspect muscular strain from prior injury. Advised following up with orthopedic as may benefit from further evaluation and likely PT. Thoracic and cervical spine  Xrays show no fracture.   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   Return precautions given.   Note: bp came down to 125/75.  New Prescriptions Discharge Medication List as of 03/24/2017  4:11 PM       Controlled Substance Prescriptions [place] Controlled Substance Registry consulted? Not Applicable   Allegra Grana, FNP 03/24/17 1651

## 2017-03-24 NOTE — Discharge Instructions (Signed)
As discussed, if pain hasn't improved with conservative therapy since I last saw you , you may need further evaluation. Orthopedics number included above- they can refer you to physical therapy and likely this would be a good step.   Restrictions as noted on letter and also as we discussed.   Heat, ibuprofen as needed.   If there is no improvement in your symptoms, or if there is any worsening of symptoms, or if you have any additional concerns, please return for re-evaluation; or, if we are closed, consider going to the Emergency Room for evaluation if symptoms urgent.

## 2017-03-24 NOTE — ED Triage Notes (Signed)
Pt here for a note to have restrictions for back pain. 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

## 2017-11-15 ENCOUNTER — Emergency Department (HOSPITAL_COMMUNITY)
Admission: EM | Admit: 2017-11-15 | Discharge: 2017-11-16 | Disposition: A | Discharge status: Home / Self Care | Payer: BLUE CROSS/BLUE SHIELD | Attending: Emergency Medicine | Admitting: Emergency Medicine

## 2017-11-15 ENCOUNTER — Other Ambulatory Visit: Payer: Self-pay

## 2017-11-15 ENCOUNTER — Encounter (HOSPITAL_COMMUNITY): Payer: Self-pay

## 2017-11-15 DIAGNOSIS — M549 Dorsalgia, unspecified: Secondary | ICD-10-CM | POA: Diagnosis present

## 2017-11-15 DIAGNOSIS — Z5321 Procedure and treatment not carried out due to patient leaving prior to being seen by health care provider: Secondary | ICD-10-CM | POA: Diagnosis not present

## 2017-11-15 NOTE — ED Triage Notes (Signed)
Pt states that he was lifting his daughter on Tuesday and hurt his upper back between his shoulder blades.

## 2017-11-16 ENCOUNTER — Encounter (HOSPITAL_COMMUNITY): Payer: Self-pay | Admitting: Family Medicine

## 2017-11-16 ENCOUNTER — Ambulatory Visit (HOSPITAL_COMMUNITY)
Admission: EM | Admit: 2017-11-16 | Discharge: 2017-11-16 | Disposition: A | Discharge status: Home / Self Care | Payer: BLUE CROSS/BLUE SHIELD | Attending: Family Medicine | Admitting: Family Medicine

## 2017-11-16 DIAGNOSIS — M62830 Muscle spasm of back: Secondary | ICD-10-CM | POA: Diagnosis not present

## 2017-11-16 MED ORDER — CYCLOBENZAPRINE HCL 10 MG PO TABS: 10.0000 mg | ORAL_TABLET | Freq: Two times a day (BID) | ORAL | 0 refills | Status: DC | PRN

## 2017-11-16 MED ORDER — IBUPROFEN 800 MG PO TABS: 800.0000 mg | ORAL_TABLET | Freq: Three times a day (TID) | ORAL | 0 refills | Status: DC | PRN

## 2017-11-16 NOTE — ED Triage Notes (Signed)
Pt here for mid upper back pain between shoulder blades. Denies injury. sts that he does a lot of heavy lifting at work. He also went to the gym Wednesday and did push ups ans ran.

## 2017-11-16 NOTE — ED Notes (Signed)
Unable to locate pt in lobby  

## 2017-11-16 NOTE — Discharge Instructions (Signed)
Avoid heavy lifting and exertion over the next 1 to 2 weeks. over the next 1-2 weeks.   Use anti-inflammatories for pain/swelling. You may take up to 800 mg Ibuprofen every 8 hours with food. You may supplement Ibuprofen with Tylenol [phone] mg every 8 hours.   May supplement with flexeril as needed. This may cause sedation, do not use when you need to drive or at work.   Continue alternating ice/heating pad.

## 2017-11-16 NOTE — ED Provider Notes (Signed)
[facility]    CSN: [id number] Arrival date & time: 11/16/17  1601     History   Chief Complaint Chief Complaint  Patient presents with  . Back Pain    HPI Joe [name] is a 22 y.o. male noncontributing past medical history presenting today for evaluation of back pain.  Patient states that since Monday, for the past 4 days he has had back pain as well as muscle spasms in his back.  He denies any specific injury, but does note he feels he has been using his back more frequently, he is been playing football, working out with weightlifting as well as he is in [employer]  He has been taking Tylenol, ibuprofen and using hot compresses without relief.  Denies any numbness or tingling, denies loss of bowel or bladder control, denies saddle anesthesia.  HPI  Past Medical History:  Diagnosis Date  . Allergy   . Congenital hammer toe    Saw Dr. Lunette Stands at age 31 yrs. Rec F/u after stops growing  . OSA (obstructive sleep apnea)    Rx T and A  . Perennial allergic rhinitis 12/27/2011   Nasal saline, cetirizine, Topical nasal steroid needed at times for control    Patient Active Problem List   Diagnosis Date Noted  . BMI 28.0-28.9,adult 01/01/2014  . Keratosis pilaris 12/28/2011  . Perennial allergic rhinitis 12/27/2011  . Congenital hammer toe   . Allergy to shellfish 08/17/2011    Past Surgical History:  Procedure Laterality Date  . ADENOIDECTOMY    . TONSILLECTOMY         Home Medications    Prior to Admission medications   Medication Sig Start Date End Date Taking? Authorizing Provider  cyclobenzaprine (FLEXERIL) 10 MG tablet Take 1 tablet (10 mg total) by mouth 2 (two) times daily as needed for muscle spasms. 11/16/17   Wieters, Hallie C, PA-C  ibuprofen (ADVIL,MOTRIN) 800 MG tablet Take 1 tablet (800 mg total) by mouth 3 (three) times daily with meals as needed. For pain 11/16/17   Wieters, Junius Creamer, PA-C    Family History Family History  Problem  Relation Age of Onset  . Hypertension Mother   . Asthma Mother   . Diabetes Father   . Hyperlipidemia Paternal Grandmother   . Diabetes Paternal Grandmother   . Hyperlipidemia Paternal Grandfather   . Diabetes Paternal Grandfather   . Hypertension Maternal Grandmother   . Kidney disease Maternal Grandmother        from hypertension  . Hypertension Maternal Grandfather   . Kidney disease Maternal Grandfather        from hypertension    Social History Social History   Tobacco Use  . Smoking status: Never Smoker  . Smokeless tobacco: Never Used  Substance Use Topics  . Alcohol use: No  . Drug use: No     Allergies   Shellfish allergy   Review of Systems Review of Systems  Constitutional: Negative for activity change, appetite change and fever.  HENT: Negative for trouble swallowing.   Eyes: Negative for visual disturbance.  Respiratory: Negative for shortness of breath.   Cardiovascular: Negative for chest pain.  Gastrointestinal: Negative for abdominal pain, nausea and vomiting.  Musculoskeletal: Positive for back pain and myalgias. Negative for arthralgias, gait problem, neck pain and neck stiffness.  Neurological: Negative for dizziness, syncope, speech difficulty, weakness, light-headedness, numbness and headaches.     Physical Exam Triage Vital Signs ED Triage Vitals  Enc Vitals  Group     BP 11/16/17 1631 (!) 142/92     Pulse Rate 11/16/17 1631 87     Resp 11/16/17 1631 18     Temp 11/16/17 1631 98.4 F (36.9 C)     Temp src --      SpO2 11/16/17 1631 96 %     Weight --      Height --      Head Circumference --      Peak Flow --      Pain Score 11/16/17 1630 5     Pain Loc --      Pain Edu? --      Excl. in GC? --    No data found.  Updated Vital Signs BP (!) 142/92   Pulse 87   Temp 98.4 F (36.9 C)   Resp 18   SpO2 96%   Visual Acuity Right Eye Distance:   Left Eye Distance:   Bilateral Distance:    Right Eye Near:   Left Eye Near:      Bilateral Near:     Physical Exam  Constitutional: He is oriented to person, place, and time. He appears well-developed and well-nourished.  Sitting comfortably on exam table  HENT:  Head: Normocephalic and atraumatic.  Eyes: Conjunctivae are normal.  Neck: Neck supple.  Cardiovascular: Normal rate and regular rhythm.  No murmur heard. Pulmonary/Chest: Effort normal and breath sounds normal. No respiratory distress.  Musculoskeletal: He exhibits no edema.  Tenderness to palpation of left-sided thoracic musculature, no focal midline tenderness to cervical, thoracic or lumbar spine.  Ambulation without abnormality  Neurological: He is alert and oriented to person, place, and time.  Skin: Skin is warm and dry.  Psychiatric: He has a normal mood and affect.  Nursing note and vitals reviewed.    UC Treatments / Results  Labs (all labs ordered are listed, but only abnormal results are displayed) Labs Reviewed - No data to display  EKG None  Radiology No results found.  Procedures Procedures (including critical care time)  Medications Ordered in UC Medications - No data to display  Initial Impression / Assessment and Plan / UC Course  I have reviewed the triage vital signs and the nursing notes.  Pertinent labs & imaging results that were available during my care of the patient were reviewed by me and considered in my medical decision making (see chart for details).     Patient likely with back strain versus muscle spasm.  We will have him continue anti-inflammatories, will provide ibuprofen 800 as well as adding Flexeril as needed.  Discussed sedation regarding Flexeril. Discussed strict return precautions. Patient verbalized understanding and is agreeable with plan.  Final Clinical Impressions(s) / UC Diagnoses   Final diagnoses:  Muscle spasm of back     Discharge Instructions     Avoid heavy lifting and exertion over the next 1 to 2 weeks. over the next 1-2  weeks.   Use anti-inflammatories for pain/swelling. You may take up to 800 mg Ibuprofen every 8 hours with food. You may supplement Ibuprofen with Tylenol [phone] mg every 8 hours.   May supplement with flexeril as needed. This may cause sedation, do not use when you need to drive or at work.   Continue alternating ice/heating pad.    ED Prescriptions    Medication Sig Dispense Auth. Provider   ibuprofen (ADVIL,MOTRIN) 800 MG tablet Take 1 tablet (800 mg total) by mouth 3 (three) times daily with meals as  needed. For pain 30 tablet Wieters, Hallie C, PA-C   cyclobenzaprine (FLEXERIL) 10 MG tablet Take 1 tablet (10 mg total) by mouth 2 (two) times daily as needed for muscle spasms. 20 tablet Wieters, Pendroy C, PA-C     Controlled Substance Prescriptions [place] Controlled Substance Registry consulted? Not Applicable   Lew Dawes, [place] 11/16/17 1702

## 2018-07-13 ENCOUNTER — Emergency Department (HOSPITAL_COMMUNITY)
Admission: EM | Admit: 2018-07-13 | Discharge: 2018-07-14 | Discharge status: Against Medical Advice | Payer: BLUE CROSS/BLUE SHIELD | Attending: Emergency Medicine | Admitting: Emergency Medicine

## 2018-07-13 ENCOUNTER — Emergency Department (HOSPITAL_COMMUNITY): Payer: BLUE CROSS/BLUE SHIELD

## 2018-07-13 ENCOUNTER — Encounter (HOSPITAL_COMMUNITY): Payer: Self-pay | Admitting: Emergency Medicine

## 2018-07-13 DIAGNOSIS — Z5321 Procedure and treatment not carried out due to patient leaving prior to being seen by health care provider: Secondary | ICD-10-CM | POA: Insufficient documentation

## 2018-07-13 DIAGNOSIS — R0789 Other chest pain: Secondary | ICD-10-CM | POA: Diagnosis present

## 2018-07-13 LAB — CBC
HCT: 42.5 % (ref 39.0–52.0)
Hemoglobin: 14.1 g/dL (ref 13.0–17.0)
MCH: 28.6 pg (ref 26.0–34.0)
MCHC: 33.2 g/dL (ref 30.0–36.0)
MCV: 86.2 fL (ref 80.0–100.0)
NRBC: 0 % (ref 0.0–0.2)
Platelets: 263 10*3/uL (ref 150–400)
RBC: 4.93 MIL/uL (ref 4.22–5.81)
RDW: 12.5 % (ref 11.5–15.5)
WBC: 4.1 10*3/uL (ref 4.0–10.5)

## 2018-07-13 LAB — BASIC METABOLIC PANEL
Anion gap: 6 (ref 5–15)
BUN: 5 mg/dL — ABNORMAL LOW (ref 6–20)
CO2: 30 mmol/L (ref 22–32)
Calcium: 9.1 mg/dL (ref 8.9–10.3)
Chloride: 100 mmol/L (ref 98–111)
Creatinine, Ser: 1.02 mg/dL (ref 0.61–1.24)
GFR calc Af Amer: >60 mL/min (ref >60)
GFR calc non Af Amer: >60 mL/min (ref >60)
Glucose, Bld: 90 mg/dL (ref 70–99)
Potassium: 4 mmol/L (ref 3.5–5.1)
SODIUM: 136 mmol/L (ref 135–145)

## 2018-07-13 LAB — I-STAT TROPONIN, ED: TROPONIN I, POC: 0 ng/mL (ref 0.00–0.08)

## 2018-07-13 NOTE — ED Triage Notes (Signed)
Reports having central chest pressure and migraine for a couple of days.  Using mucinex and dayquil at home but isn't helping.

## 2018-07-14 NOTE — ED Notes (Signed)
Pt has decided to leave. He can't wait any longer

## 2018-07-28 ENCOUNTER — Encounter (HOSPITAL_COMMUNITY): Payer: Self-pay | Admitting: Emergency Medicine

## 2018-07-28 ENCOUNTER — Emergency Department (HOSPITAL_COMMUNITY)
Admission: EM | Admit: 2018-07-28 | Discharge: 2018-07-28 | Disposition: A | Discharge status: Home / Self Care | Payer: BLUE CROSS/BLUE SHIELD | Attending: Emergency Medicine | Admitting: Emergency Medicine

## 2018-07-28 ENCOUNTER — Emergency Department (HOSPITAL_COMMUNITY): Payer: BLUE CROSS/BLUE SHIELD

## 2018-07-28 DIAGNOSIS — R0789 Other chest pain: Secondary | ICD-10-CM | POA: Insufficient documentation

## 2018-07-28 DIAGNOSIS — R079 Chest pain, unspecified: Secondary | ICD-10-CM

## 2018-07-28 LAB — CBC
HCT: 43.2 % (ref 39.0–52.0)
Hemoglobin: 14.3 g/dL (ref 13.0–17.0)
MCH: 28.6 pg (ref 26.0–34.0)
MCHC: 33.1 g/dL (ref 30.0–36.0)
MCV: 86.4 fL (ref 80.0–100.0)
Platelets: 280 10*3/uL (ref 150–400)
RBC: 5 MIL/uL (ref 4.22–5.81)
RDW: 12.3 % (ref 11.5–15.5)
WBC: 4.8 10*3/uL (ref 4.0–10.5)
nRBC: 0 % (ref 0.0–0.2)

## 2018-07-28 LAB — I-STAT TROPONIN, ED: TROPONIN I, POC: 0 ng/mL (ref 0.00–0.08)

## 2018-07-28 LAB — BASIC METABOLIC PANEL
Anion gap: 6 (ref 5–15)
BUN: 12 mg/dL (ref 6–20)
CO2: 23 mmol/L (ref 22–32)
Calcium: 9.3 mg/dL (ref 8.9–10.3)
Chloride: 108 mmol/L (ref 98–111)
Creatinine, Ser: 1.03 mg/dL (ref 0.61–1.24)
GFR calc Af Amer: >60 mL/min (ref >60)
GLUCOSE: 88 mg/dL (ref 70–99)
Potassium: 3.8 mmol/L (ref 3.5–5.1)
Sodium: 137 mmol/L (ref 135–145)

## 2018-07-28 NOTE — ED Provider Notes (Signed)
[facility]   CSN: [id number] Arrival date & time: 07/28/18  0017     History   Chief Complaint Chief Complaint  Patient presents with  . Chest Pain    HPI Joe [name] is a 23 y.o. male.  The history is provided by the patient and medical records.     23 year old male with history of seasonal allergies, sleep apnea, presenting to the ED with chest pain.  Patient reports he has been having intermittent chest pain for the past several months.  States it occurs randomly, more noticeable if he exerts a lot of energy, gets upset, or under extreme stress.  States once he stops moving around or calms himself down pain will resolve.  He denies any shortness of breath, palpitations, dizziness, weakness, or feelings of syncope.  He has no prior cardiac history.  He denies smoking cigarettes but reports smoking "other things".  He denies any illicit drug use.  Reports he has been evaluated for this in the past and told it was related to a upper respiratory infection, however states "I did not feel sick at that time".  He denies any current URI symptoms such as cough, sore throat, nasal congestion, or fever.  Past Medical History:  Diagnosis Date  . Allergy   . Congenital hammer toe    Saw Dr. Lunette StandsAnna Voytek at age 14 yrs. Rec F/u after stops growing  . OSA (obstructive sleep apnea)    Rx T and A  . Perennial allergic rhinitis 12/27/2011   Nasal saline, cetirizine, Topical nasal steroid needed at times for control    Patient Active Problem List   Diagnosis Date Noted  . BMI 28.0-28.9,adult 01/01/2014  . Keratosis pilaris 12/28/2011  . Perennial allergic rhinitis 12/27/2011  . Congenital hammer toe   . Allergy to shellfish 08/17/2011    Past Surgical History:  Procedure Laterality Date  . ADENOIDECTOMY    . TONSILLECTOMY          Home Medications    Prior to Admission medications   Medication Sig Start Date End Date  Taking? Authorizing Provider  cyclobenzaprine (FLEXERIL) 10 MG tablet Take 1 tablet (10 mg total) by mouth 2 (two) times daily as needed for muscle spasms. 11/16/17   Wieters, Hallie C, PA-C  ibuprofen (ADVIL,MOTRIN) 800 MG tablet Take 1 tablet (800 mg total) by mouth 3 (three) times daily with meals as needed. For pain 11/16/17   Wieters, Junius CreamerHallie C, PA-C    Family History Family History  Problem Relation Age of Onset  . Hypertension Mother   . Asthma Mother   . Diabetes Father   . Hyperlipidemia Paternal Grandmother   . Diabetes Paternal Grandmother   . Hyperlipidemia Paternal Grandfather   . Diabetes Paternal Grandfather   . Hypertension Maternal Grandmother   . Kidney disease Maternal Grandmother        from hypertension  . Hypertension Maternal Grandfather   . Kidney disease Maternal Grandfather        from hypertension    Social History Social History   Tobacco Use  . Smoking status: Never Smoker  . Smokeless tobacco: Never Used  Substance Use Topics  . Alcohol use: No  . Drug use: No     Allergies   Shellfish allergy   Review of Systems Review of Systems  Cardiovascular: Positive for chest pain.  All other systems reviewed and are negative.    Physical Exam Updated Vital Signs BP 133/75  Pulse 66   Temp 99.5 F (37.5 C) (Oral)   Resp 17   Ht 6\' 3"  (1.905 m)   Wt 117.9 kg   SpO2 96%   BMI 32.50 kg/m   Physical Exam Vitals signs and nursing note reviewed.  Constitutional:      Appearance: He is well-developed.     Comments: Sleeping, had to be awoken for exam  HENT:     Head: Normocephalic and atraumatic.  Eyes:     Conjunctiva/sclera: Conjunctivae normal.     Pupils: Pupils are equal, round, and reactive to light.  Neck:     Musculoskeletal: Normal range of motion.  Cardiovascular:     Rate and Rhythm: Normal rate and regular rhythm.     Heart sounds: Normal heart sounds.  Pulmonary:     Effort: Pulmonary effort is normal.     Breath  sounds: Normal breath sounds.  Abdominal:     General: Bowel sounds are normal.     Palpations: Abdomen is soft.  Musculoskeletal: Normal range of motion.  Skin:    General: Skin is warm and dry.  Neurological:     Mental Status: He is alert and oriented to person, place, and time.      ED Treatments / Results  Labs (all labs ordered are listed, but only abnormal results are displayed) Labs Reviewed  BASIC METABOLIC PANEL  CBC  I-STAT TROPONIN, ED    EKG None  Radiology Dg Chest 2 View  Result Date: 07/28/2018 CLINICAL DATA:  Chest pain EXAM: CHEST - 2 VIEW COMPARISON:  07/13/2018 FINDINGS: No acute consolidation or effusion. Borderline cardiomegaly. No pneumothorax. IMPRESSION: No active cardiopulmonary disease. Electronically Signed   By: Jasmine Pang M.D.   On: 07/28/2018 01:06    Procedures Procedures (including critical care time)  Medications Ordered in ED Medications - No data to display   Initial Impression / Assessment and Plan / ED Course  I have reviewed the triage vital signs and the nursing notes.  Pertinent labs & imaging results that were available during my care of the patient were reviewed by me and considered in my medical decision making (see chart for details).  23 year old male here with intermittent chest pain for the past several months.  Reports this occurs randomly but does seem more associated with exertional activity, getting upset, or extreme stress.  Resolves with rest or when he calms down.  He is afebrile and nontoxic in appearance.  Initially sleeping on exam and had to be awoken for history and physical.  He is in no acute distress.  Exam is benign.  EKG is nonischemic.  Labs reassuring.  Chest x-ray clear.  Given prolonged period of symptoms with negative work-up here today, feel he stable for discharge home.  Low suspicion for ACS, PE, dissection, acute cardiac event.  Recommended PCP follow-up--does not have PCP currently, he was given  instructions on how to establish care.  He can return here for any new or worsening symptoms.  Final Clinical Impressions(s) / ED Diagnoses   Final diagnoses:  Chest pain in adult    ED Discharge Orders    None       Garlon Hatchet, PA-C 07/28/18 0546    Gilda Crease, MD 07/28/18 2355

## 2018-07-28 NOTE — ED Notes (Signed)
Patient reports LEFT sided chest pain x "a couple of months now". States "when I get mad/upset, or even when I'm working out sometimes my whole left side of my body starts to shake and my chest will start hurting. But it will go away when I calm down". Patient describes pain as pressure that radiates to his back and across bilateral shoulders.

## 2018-07-28 NOTE — ED Notes (Signed)
Patient verbalizes understanding of medications and discharge instructions. No further questions at this time. VSS and patient ambulatory at discharge.   

## 2018-07-28 NOTE — Discharge Instructions (Signed)
Work-up today looked great-- normal EKG, labs, CXR. Call the number on back of insurance care to establish care with primary care doctor.  Can also call the 800 number on paperwork from here for assistance. Return here for new concerns.

## 2018-07-28 NOTE — ED Triage Notes (Signed)
Pt reports intermittent CP X1 yr. Pt states he has been seen before for same.

## 2018-08-24 ENCOUNTER — Encounter (HOSPITAL_COMMUNITY): Payer: Self-pay | Admitting: Emergency Medicine

## 2018-08-24 ENCOUNTER — Ambulatory Visit (HOSPITAL_COMMUNITY)
Admission: EM | Admit: 2018-08-24 | Discharge: 2018-08-24 | Disposition: A | Discharge status: Home / Self Care | Payer: Self-pay | Attending: Family Medicine | Admitting: Family Medicine

## 2018-08-24 ENCOUNTER — Other Ambulatory Visit: Payer: Self-pay

## 2018-08-24 DIAGNOSIS — J111 Influenza due to unidentified influenza virus with other respiratory manifestations: Secondary | ICD-10-CM

## 2018-08-24 DIAGNOSIS — R69 Illness, unspecified: Secondary | ICD-10-CM

## 2018-08-24 MED ORDER — BENZONATATE 100 MG PO CAPS: ORAL_CAPSULE | ORAL | 0 refills | Status: DC

## 2018-08-24 NOTE — Discharge Instructions (Addendum)

## 2018-08-24 NOTE — ED Triage Notes (Signed)
The patient presented to the [facility] with a complaint of a cough, weakness and headache x 3 days.

## 2018-08-28 NOTE — ED Provider Notes (Signed)
[facility] CARE CENTER   [id number] 08/24/18 Arrival Time: 1715  ASSESSMENT & PLAN:  1. Influenza-like illness    See AVS for discharge instructions.  Meds ordered this encounter  Medications  . benzonatate (TESSALON) 100 MG capsule    Sig: Take 1 capsule by mouth every 8 (eight) hours for cough.    Dispense:  21 capsule    Refill:  0    Discussed typical duration of symptoms. OTC symptom care as needed. Ensure adequate fluid intake and rest. May f/u with PCP or here as needed.  Reviewed expectations re: course of current medical issues. Questions answered. Outlined signs and symptoms indicating need for more acute intervention. Patient verbalized understanding. After Visit Summary given.   SUBJECTIVE: History from: patient.  Joe [name] is a 23 y.o. male who presents with complaint of nasal congestion, post-nasal drainage, and a persistent dry cough; without sore throat. Onset abrupt, 3 days ago; with fatigue and with body aches. SOB: none. Wheezing: none. Fever: yes, questions subjective with chills. Overall normal PO intake without n/v. Known sick contacts: no. No specific or significant aggravating or alleviating factors reported. OTC treatment: none reported. Also with mild generalized headache. Not the worst headache of her life. Ambulatory without difficulty.  Social History   Tobacco Use  Smoking Status Never Smoker  Smokeless Tobacco Never Used    ROS: As per HPI.   OBJECTIVE:  Vitals:   08/24/18 1801  BP: 135/82  Pulse: 63  Resp: 16  Temp: 98.6 F (37 C)  TempSrc: Temporal  SpO2: 99%     General appearance: alert; appears fatigued HEENT: nasal congestion; clear runny nose; throat irritation secondary to post-nasal drainage Neck: supple without LAD CV: RRR Lungs: unlabored respirations, symmetrical air entry without wheezing; cough: moderate Abd: soft Ext: no LE edema Skin: warm and dry Psychological: alert and cooperative; normal mood  and affect    Allergies  Allergen Reactions  . Shellfish Allergy     postive skin test -- only positive for food    Past Medical History:  Diagnosis Date  . Allergy   . Congenital hammer toe    Saw Dr. Lunette [name] at age 76 yrs. Rec F/u after stops growing  . OSA (obstructive sleep apnea)    Rx T and A  . Perennial allergic rhinitis 12/27/2011   Nasal saline, cetirizine, Topical nasal steroid needed at times for control   Family History  Problem Relation Age of Onset  . Hypertension Mother   . Asthma Mother   . Diabetes Father   . Hyperlipidemia Paternal Grandmother   . Diabetes Paternal Grandmother   . Hyperlipidemia Paternal Grandfather   . Diabetes Paternal Grandfather   . Hypertension Maternal Grandmother   . Kidney disease Maternal Grandmother        from hypertension  . Hypertension Maternal Grandfather   . Kidney disease Maternal Grandfather        from hypertension   Social History   Socioeconomic History  . Marital status: Single    Spouse name: Not on file  . Number of children: Not on file  . Years of education: Not on file  . Highest education level: Not on file  Occupational History  . Not on file  Social Needs  . Financial resource strain: Not on file  . Food insecurity:    Worry: Not on file    Inability: Not on file  . Transportation needs:    Medical: Not on file  Non-medical: Not on file  Tobacco Use  . Smoking status: Never Smoker  . Smokeless tobacco: Never Used  Substance and Sexual Activity  . Alcohol use: No  . Drug use: No  . Sexual activity: Never  Lifestyle  . Physical activity:    Days per week: Not on file    Minutes per session: Not on file  . Stress: Not on file  Relationships  . Social connections:    Talks on phone: Not on file    Gets together: Not on file    Attends religious service: Not on file    Active member of club or organization: Not on file    Attends meetings of clubs or organizations: Not on file     Relationship status: Not on file  . Intimate partner violence:    Fear of current or ex partner: Not on file    Emotionally abused: Not on file    Physically abused: Not on file    Forced sexual activity: Not on file  Other Topics Concern  . Not on file  Social History Narrative  . Not on file           Joe [name], Joe Mcnee, MD 08/28/18 1010

## 2018-12-20 ENCOUNTER — Emergency Department (HOSPITAL_COMMUNITY)
Admission: EM | Admit: 2018-12-20 | Discharge: 2018-12-20 | Disposition: A | Discharge status: Home / Self Care | Payer: BC Managed Care – PPO | Attending: Emergency Medicine | Admitting: Emergency Medicine

## 2018-12-20 ENCOUNTER — Other Ambulatory Visit: Payer: Self-pay

## 2018-12-20 ENCOUNTER — Encounter (HOSPITAL_COMMUNITY): Payer: Self-pay | Admitting: *Deleted

## 2018-12-20 DIAGNOSIS — Z20828 Contact with and (suspected) exposure to other viral communicable diseases: Secondary | ICD-10-CM | POA: Insufficient documentation

## 2018-12-20 DIAGNOSIS — Z20822 Contact with and (suspected) exposure to covid-19: Secondary | ICD-10-CM

## 2018-12-20 DIAGNOSIS — R51 Headache: Secondary | ICD-10-CM | POA: Diagnosis present

## 2018-12-20 NOTE — ED Triage Notes (Signed)
Pt reports exposure to co-workers who tested positive for covid-19.  He endorses mild h/a, denies cough, fever, or sore throat.

## 2018-12-20 NOTE — ED Provider Notes (Signed)
[facility] EMERGENCY DEPARTMENT Provider Note   CSN: [id number] Arrival date & time: 12/20/18  1752    History   Chief Complaint Chief Complaint  Patient presents with  . Covid exposure    HPI Joe [name] is a 23 y.o. male who presents with mild headache after exposure to coworkers with positive COVID-19 test.  His headache is localized mostly in the back of his head.  He denies any neck pain.  Patient has not had a fever, cough, shortness of breath, nasal congestion, ear pain, sore throat.  He reports he had some abdominal cramping earlier and feeling like he had to have diarrhea, however, that has resolved and he never did have any diarrhea.  He denies any nausea or vomiting.  He has not taken any medication at home for symptoms.     HPI  Past Medical History:  Diagnosis Date  . Allergy   . Congenital hammer toe    Saw Dr. Lunette Stands at age 72 yrs. Rec F/u after stops growing  . OSA (obstructive sleep apnea)    Rx T and A  . Perennial allergic rhinitis 12/27/2011   Nasal saline, cetirizine, Topical nasal steroid needed at times for control    Patient Active Problem List   Diagnosis Date Noted  . BMI 28.0-28.9,adult 01/01/2014  . Keratosis pilaris 12/28/2011  . Perennial allergic rhinitis 12/27/2011  . Congenital hammer toe   . Allergy to shellfish 08/17/2011    Past Surgical History:  Procedure Laterality Date  . ADENOIDECTOMY    . TONSILLECTOMY          Home Medications    Prior to Admission medications   Medication Sig Start Date End Date Taking? Authorizing Provider  benzonatate (TESSALON) 100 MG capsule Take 1 capsule by mouth every 8 (eight) hours for cough. 08/24/18   Mardella Layman, MD  ibuprofen (ADVIL,MOTRIN) 800 MG tablet Take 1 tablet (800 mg total) by mouth 3 (three) times daily with meals as needed. For pain 11/16/17   Wieters, Junius Creamer, PA-C    Family History Family History  Problem Relation Age of Onset  . Hypertension  Mother   . Asthma Mother   . Diabetes Father   . Hyperlipidemia Paternal Grandmother   . Diabetes Paternal Grandmother   . Hyperlipidemia Paternal Grandfather   . Diabetes Paternal Grandfather   . Hypertension Maternal Grandmother   . Kidney disease Maternal Grandmother        from hypertension  . Hypertension Maternal Grandfather   . Kidney disease Maternal Grandfather        from hypertension    Social History Social History   Tobacco Use  . Smoking status: Never Smoker  . Smokeless tobacco: Never Used  Substance Use Topics  . Alcohol use: No  . Drug use: No     Allergies   Shellfish allergy   Review of Systems Review of Systems  Constitutional: Negative for chills and fever.  HENT: Negative for facial swelling and sore throat.   Respiratory: Negative for shortness of breath.   Cardiovascular: Negative for chest pain.  Gastrointestinal: Positive for abdominal pain (resolved). Negative for nausea and vomiting.  Genitourinary: Negative for dysuria.  Musculoskeletal: Negative for back pain.  Skin: Negative for rash and wound.  Neurological: Positive for headaches.  Psychiatric/Behavioral: The patient is not nervous/anxious.      Physical Exam Updated Vital Signs BP 135/88   Pulse 70   Temp 99 F (37.2 C) (Oral)  Resp 16   SpO2 100%   Physical Exam Vitals signs and nursing note reviewed.  Constitutional:      General: He is not in acute distress.    Appearance: He is well-developed. He is not diaphoretic.  HENT:     Head: Normocephalic and atraumatic.     Mouth/Throat:     Pharynx: No oropharyngeal exudate.  Eyes:     General: No scleral icterus.       Right eye: No discharge.        Left eye: No discharge.     Extraocular Movements: Extraocular movements intact.     Conjunctiva/sclera: Conjunctivae normal.     Pupils: Pupils are equal, round, and reactive to light.  Neck:     Musculoskeletal: Full passive range of motion without pain, normal  range of motion and neck supple. No neck rigidity, spinous process tenderness or muscular tenderness.     Thyroid: No thyromegaly.  Cardiovascular:     Rate and Rhythm: Normal rate and regular rhythm.     Heart sounds: Normal heart sounds. No murmur. No friction rub. No gallop.   Pulmonary:     Effort: Pulmonary effort is normal. No respiratory distress.     Breath sounds: Normal breath sounds. No stridor. No wheezing or rales.  Abdominal:     General: Bowel sounds are normal. There is no distension.     Palpations: Abdomen is soft.     Tenderness: There is no abdominal tenderness. There is no guarding or rebound.  Lymphadenopathy:     Cervical: No cervical adenopathy.  Skin:    General: Skin is warm and dry.     Coloration: Skin is not pale.     Findings: No rash.  Neurological:     Mental Status: He is alert.     Coordination: Coordination normal.     Comments: CN 3-12 intact; normal sensation throughout; 5/5 strength in all 4 extremities; equal bilateral grip strength      ED Treatments / Results  Labs (all labs ordered are listed, but only abnormal results are displayed) Labs Reviewed  NOVEL CORONAVIRUS, NAA (HOSPITAL ORDER, SEND-OUT TO REF LAB)    EKG None  Radiology No results found.  Procedures Procedures (including critical care time)  Medications Ordered in ED Medications - No data to display   Initial Impression / Assessment and Plan / ED Course  I have reviewed the triage vital signs and the nursing notes.  Pertinent labs & imaging results that were available during my care of the patient were reviewed by me and considered in my medical decision making (see chart for details).        Patient presenting with mild headache and resolved abdominal pain.  He is concerned about COVID-19 considering was exposed to 2 coworkers that tested positive.  Send out COVID-19 test pending.  Patient's abdomen is soft and nontender.  Do not feel further work-up is  indicated today.  Patient vies to take Tylenol for headache at home as needed.  He is advised to isolate until symptom free for 3 days and COVID negative.  Patient given return precautions.  He understands and agrees with plan.  Patient vital stable throughout ED course and discharged in satisfactory condition  Final Clinical Impressions(s) / ED Diagnoses   Final diagnoses:  Exposure to Covid-19 Virus    ED Discharge Orders    None       Emi Holes, Cordelia Poche 12/20/18 2212    Chaney Malling  Hsienta, MD 12/27/18 [id number]

## 2018-12-20 NOTE — Discharge Instructions (Addendum)
You will be called in 2 to 3 days with your test results.  Please return the emergency department develop any new or worsening symptoms.  Please stay isolated at your home already from people until your symptoms have resolved for 3 days and COVID-19 test is negative.       Person Under Monitoring Name: Joe [name]  Location: [address] [place] [address]   Infection Prevention Recommendations for Individuals Confirmed to have, or Being Evaluated for, 2019 Novel Coronavirus (COVID-19) Infection Who Receive Care at Home  Individuals who are confirmed to have, or are being evaluated for, COVID-19 should follow the prevention steps below until a healthcare provider or local or state health department says they can return to normal activities.  Stay home except to get medical care You should restrict activities outside your home, except for getting medical care. Do not go to work, school, or public areas, and do not use public transportation or taxis.  Call ahead before visiting your doctor Before your medical appointment, call the healthcare provider and tell them that you have, or are being evaluated for, COVID-19 infection. This will help the healthcare providers office take steps to keep other people from getting infected. Ask your healthcare provider to call the local or state health department.  Monitor your symptoms Seek prompt medical attention if your illness is worsening (e.g., difficulty breathing). Before going to your medical appointment, call the healthcare provider and tell them that you have, or are being evaluated for, COVID-19 infection. Ask your healthcare provider to call the local or state health department.  Wear a facemask You should wear a facemask that covers your nose and mouth when you are in the same room with other people and when you visit a healthcare provider. People who live with or visit you should also wear a facemask while they are in  the same room with you.  Separate yourself from other people in your home As much as possible, you should stay in a different room from other people in your home. Also, you should use a separate bathroom, if available.  Avoid sharing household items You should not share dishes, drinking glasses, cups, eating utensils, towels, bedding, or other items with other people in your home. After using these items, you should wash them thoroughly with soap and water.  Cover your coughs and sneezes Cover your mouth and nose with a tissue when you cough or sneeze, or you can cough or sneeze into your sleeve. Throw used tissues in a lined trash can, and immediately wash your hands with soap and water for at least 20 seconds or use an alcohol-based hand rub.  Wash your [employer] your hands often and thoroughly with soap and water for at least 20 seconds. You can use an alcohol-based hand sanitizer if soap and water are not available and if your hands are not visibly dirty. Avoid touching your eyes, nose, and mouth with unwashed hands.   Prevention Steps for Caregivers and Household Members of Individuals Confirmed to have, or Being Evaluated for, COVID-19 Infection Being Cared for in the Home  If you live with, or provide care at home for, a person confirmed to have, or being evaluated for, COVID-19 infection please follow these guidelines to prevent infection:  Follow healthcare providers instructions Make sure that you understand and can help the patient follow any healthcare provider instructions for all care.  Provide for the patients basic needs You should help the patient with basic  needs in the home and provide support for getting groceries, prescriptions, and other personal needs.  Monitor the patients symptoms If they are getting sicker, call his or her medical provider and tell them that the patient has, or is being evaluated for, COVID-19 infection. This will help the healthcare  providers office take steps to keep other people from getting infected. Ask the healthcare provider to call the local or state health department.  Limit the number of people who have contact with the patient If possible, have only one caregiver for the patient. Other household members should stay in another home or place of residence. If this is not possible, they should stay in another room, or be separated from the patient as much as possible. Use a separate bathroom, if available. Restrict visitors who do not have an essential need to be in the home.  Keep older adults, very young children, and other sick people away from the patient Keep older adults, very young children, and those who have compromised immune systems or chronic health conditions away from the patient. This includes people with chronic heart, lung, or kidney conditions, diabetes, and cancer.  Ensure good ventilation Make sure that shared spaces in the home have good air flow, such as from an air conditioner or an opened window, weather permitting.  Wash your hands often Wash your hands often and thoroughly with soap and water for at least 20 seconds. You can use an alcohol based hand sanitizer if soap and water are not available and if your hands are not visibly dirty. Avoid touching your eyes, nose, and mouth with unwashed hands. Use disposable paper towels to dry your hands. If not available, use dedicated cloth towels and replace them when they become wet.  Wear a facemask and gloves Wear a disposable facemask at all times in the room and gloves when you touch or have contact with the patients blood, body fluids, and/or secretions or excretions, such as sweat, saliva, sputum, nasal mucus, vomit, urine, or feces.  Ensure the mask fits over your nose and mouth tightly, and do not touch it during use. Throw out disposable facemasks and gloves after using them. Do not reuse. Wash your hands immediately after removing your  facemask and gloves. If your personal clothing becomes contaminated, carefully remove clothing and launder. Wash your hands after handling contaminated clothing. Place all used disposable facemasks, gloves, and other waste in a lined container before disposing them with other household waste. Remove gloves and wash your hands immediately after handling these items.  Do not share dishes, glasses, or other household items with the patient Avoid sharing household items. You should not share dishes, drinking glasses, cups, eating utensils, towels, bedding, or other items with a patient who is confirmed to have, or being evaluated for, COVID-19 infection. After the person uses these items, you should wash them thoroughly with soap and water.  Wash laundry thoroughly Immediately remove and wash clothes or bedding that have blood, body fluids, and/or secretions or excretions, such as sweat, saliva, sputum, nasal mucus, vomit, urine, or feces, on them. Wear gloves when handling laundry from the patient. Read and follow directions on labels of laundry or clothing items and detergent. In general, wash and dry with the warmest temperatures recommended on the label.  Clean all areas the individual has used often Clean all touchable surfaces, such as counters, tabletops, doorknobs, bathroom fixtures, toilets, phones, keyboards, tablets, and bedside tables, every day. Also, clean any surfaces that may have blood,  body fluids, and/or secretions or excretions on them. Wear gloves when cleaning surfaces the patient has come in contact with. Use a diluted bleach solution (e.g., dilute bleach with 1 part bleach and 10 parts water) or a household disinfectant with a label that says EPA-registered for coronaviruses. To make a bleach solution at home, add 1 tablespoon of bleach to 1 quart (4 cups) of water. For a larger supply, add  cup of bleach to 1 gallon (16 cups) of water. Read labels of cleaning products and  follow recommendations provided on product labels. Labels contain instructions for safe and effective use of the cleaning product including precautions you should take when applying the product, such as wearing gloves or eye protection and making sure you have good ventilation during use of the product. Remove gloves and wash hands immediately after cleaning.  Monitor yourself for signs and symptoms of illness Caregivers and household members are considered close contacts, should monitor their health, and will be asked to limit movement outside of the home to the extent possible. Follow the monitoring steps for close contacts listed on the symptom monitoring form.   ? If you have additional questions, contact your local health department or call the epidemiologist on call at [phone] (available 24/7). ? This guidance is subject to change. For the most up-to-date guidance from [facility], please refer to their website: TripMetro.huhttps://www.cdc.gov/coronavirus/2019-ncov/hcp/guidance-prevent-spread.html

## 2018-12-22 LAB — NOVEL CORONAVIRUS, NAA (HOSP ORDER, SEND-OUT TO REF LAB; TAT 18-24 HRS): SARS-CoV-2, NAA: NOT DETECTED

## 2019-03-28 ENCOUNTER — Other Ambulatory Visit: Payer: Self-pay

## 2019-03-28 DIAGNOSIS — Z20822 Contact with and (suspected) exposure to covid-19: Secondary | ICD-10-CM

## 2019-03-29 LAB — NOVEL CORONAVIRUS, NAA: SARS-CoV-2, NAA: NOT DETECTED

## 2019-03-30 ENCOUNTER — Telehealth: Payer: Self-pay

## 2019-03-30 NOTE — Telephone Encounter (Signed)
Pt notified of negative COVID-19 results. Understanding verbalized.   

## 2019-04-10 ENCOUNTER — Ambulatory Visit (HOSPITAL_COMMUNITY)
Admission: EM | Admit: 2019-04-10 | Discharge: 2019-04-10 | Disposition: A | Discharge status: Home / Self Care | Payer: BC Managed Care – PPO | Attending: Urgent Care | Admitting: Urgent Care

## 2019-04-10 ENCOUNTER — Other Ambulatory Visit: Payer: Self-pay

## 2019-04-10 ENCOUNTER — Encounter (HOSPITAL_COMMUNITY): Payer: Self-pay

## 2019-04-10 DIAGNOSIS — R109 Unspecified abdominal pain: Secondary | ICD-10-CM | POA: Diagnosis not present

## 2019-04-10 LAB — POCT URINALYSIS DIP (DEVICE)
Bilirubin Urine: NEGATIVE
Glucose, UA: NEGATIVE mg/dL
Hgb urine dipstick: NEGATIVE
Ketones, ur: NEGATIVE mg/dL
Leukocytes,Ua: NEGATIVE
Nitrite: NEGATIVE
Protein, ur: NEGATIVE mg/dL
Specific Gravity, Urine: >=1.03 (ref 1.005–1.030)
Urobilinogen, UA: 0.2 mg/dL (ref 0.0–1.0)
pH: 6 (ref 5.0–8.0)

## 2019-04-10 MED ORDER — IBUPROFEN 800 MG PO TABS: 800.0000 mg | ORAL_TABLET | Freq: Three times a day (TID) | ORAL | 0 refills | Status: DC | PRN

## 2019-04-10 NOTE — ED Triage Notes (Signed)
Pt states he has right side flank pain. Pt states he needs a work note.

## 2019-04-10 NOTE — Discharge Instructions (Signed)
Your urine did not show any signs of blood or infection. This could be musculoskeletal pain or you could have already passed a kidney stone. Try taking ibuprofen as needed for pain and pushing fluids If your symptoms worsen to include more severe pain, nausea or vomiting you will need to go the hospital.

## 2019-04-10 NOTE — ED Provider Notes (Signed)
[facility]    CSN: [id number] Arrival date & time: 04/10/19  3419      History   Chief Complaint Chief Complaint  Patient presents with  . Flank Pain    HPI Joe Joe [name] is Joe [name] 23 y.o. male.   Patient is Joe [name] 23 year old male presents today with right flank pain.  This pain is been pretty constant since yesterday.  Describes as dull ache/squeezing sensation.  Rates pain 4-10.  Denies any history of the same or kidney stones.  Reporting yesterday he saw some blood in his urine.  No dysuria, urinary frequency.  No penile discharge, testicle swelling or pain.  No abdominal pain, fevers, nausea or vomiting.  Denies any heavy lifting at work or strenuous movements.  No injuries.  He has not take anything for his symptoms.  ROS per HPI      Past Medical History:  Diagnosis Date  . Allergy   . Congenital hammer toe    Saw Dr. Almedia Balls at age 74 yrs. Rec F/u after stops growing  . OSA (obstructive sleep apnea)    Rx T and Joe [name]  . Perennial allergic rhinitis 12/27/2011   Nasal saline, cetirizine, Topical nasal steroid needed at times for control    Patient Active Problem List   Diagnosis Date Noted  . BMI 28.0-28.9,adult 01/01/2014  . Keratosis pilaris 12/28/2011  . Perennial allergic rhinitis 12/27/2011  . Congenital hammer toe   . Allergy to shellfish 08/17/2011    Past Surgical History:  Procedure Laterality Date  . ADENOIDECTOMY    . TONSILLECTOMY         Home Medications    Prior to Admission medications   Medication Sig Start Date End Date Taking? Authorizing Provider  benzonatate (TESSALON) 100 MG capsule Take 1 capsule by mouth every 8 (eight) hours for cough. 08/24/18   Vanessa Kick, MD  ibuprofen (ADVIL) 800 MG tablet Take 1 tablet (800 mg total) by mouth 3 (three) times daily with meals as needed. For pain 04/10/19   Orvan July, NP    Family History Family History  Problem Relation Age of Onset  . Hypertension Mother   . Asthma Mother    . Diabetes Father   . Hyperlipidemia Paternal Grandmother   . Diabetes Paternal Grandmother   . Hyperlipidemia Paternal Grandfather   . Diabetes Paternal Grandfather   . Hypertension Maternal Grandmother   . Kidney disease Maternal Grandmother        from hypertension  . Hypertension Maternal Grandfather   . Kidney disease Maternal Grandfather        from hypertension    Social History Social History   Tobacco Use  . Smoking status: Never Smoker  . Smokeless tobacco: Never Used  Substance Use Topics  . Alcohol use: No  . Drug use: No     Allergies   Shellfish allergy   Review of Systems Review of Systems   Physical Exam Triage Vital Signs ED Triage Vitals  Enc Vitals Group     BP 04/10/19 1009 134/84     Pulse Rate 04/10/19 1009 82     Resp 04/10/19 1009 16     Temp 04/10/19 1009 98.1 F (36.7 C)     Temp Source 04/10/19 1009 Temporal     SpO2 04/10/19 1009 99 %     Weight 04/10/19 1011 260 lb (117.9 kg)     Height --      Head Circumference --  Peak Flow --      Pain Score 04/10/19 1011 4     Pain Loc --      Pain Edu? --      Excl. in GC? --    No data found.  Updated Vital Signs BP 134/84 (BP Location: Left Arm)   Pulse 82   Temp 98.1 F (36.7 C) (Temporal)   Resp 16   Wt 260 lb (117.9 kg)   SpO2 99%   BMI 32.50 kg/m   Visual Acuity Right Eye Distance:   Left Eye Distance:   Bilateral Distance:    Right Eye Near:   Left Eye Near:    Bilateral Near:     Physical Exam Vitals signs and nursing note reviewed.  Constitutional:      General: He is not in acute distress.    Appearance: Normal appearance. He is not ill-appearing, toxic-appearing or diaphoretic.  HENT:     Head: Normocephalic and atraumatic.     Nose: Nose normal.  Eyes:     Conjunctiva/sclera: Conjunctivae normal.  Neck:     Musculoskeletal: Normal range of motion.  Pulmonary:     Effort: Pulmonary effort is normal.  Abdominal:     Palpations: Abdomen is soft.      Comments: Mild right CVA discomfort with deep palpation. No bruising, swelling or rashes.  Musculoskeletal: Normal range of motion.  Skin:    General: Skin is warm and dry.  Neurological:     Mental Status: He is alert.  Psychiatric:        Mood and Affect: Mood normal.      UC Treatments / Results  Labs (all labs ordered are listed, but only abnormal results are displayed) Labs Reviewed  POCT URINALYSIS DIP (DEVICE)    EKG   Radiology No results found.  Procedures Procedures (including critical care time)  Medications Ordered in UC Medications - No data to display  Initial Impression / Assessment and Plan / UC Course  I have reviewed the triage vital signs and the nursing notes.  Pertinent labs & imaging results that were available during my care of the patient were reviewed by me and considered in my medical decision making (see chart for details).     Patient here today presenting with right flank pain. Musculoskeletal versus kidney stone. Urine negative for hematuria or infection here today. Most likely musculoskeletal or kidney stone that has passed with lingering discomfort. We will have him do ibuprofen for pain Push fluids and monitor symptoms.  Work note given as requested.  Recommended if symptoms return or become more severe with nausea, vomiting he will to go to the hospital. Patient understanding and agree.  Final Clinical Impressions(s) / UC Diagnoses   Final diagnoses:  Flank pain     Discharge Instructions     Your urine did not show any signs of blood or infection. This could be musculoskeletal pain or you could have already passed Joe [name] kidney stone. Try taking ibuprofen as needed for pain and pushing fluids If your symptoms worsen to include more severe pain, nausea or vomiting you will need to go the hospital.    ED Prescriptions    Medication Sig Dispense Auth. Provider   ibuprofen (ADVIL) 800 MG tablet Take 1 tablet (800 mg total)  by mouth 3 (three) times daily with meals as needed. For pain 30 tablet Joe Heinemann A, NP     PDMP not reviewed this encounter.   Janace Aris, NP 04/10/19  1118  

## 2019-04-25 ENCOUNTER — Other Ambulatory Visit: Payer: Self-pay

## 2019-04-25 DIAGNOSIS — Z20822 Contact with and (suspected) exposure to covid-19: Secondary | ICD-10-CM

## 2019-04-26 LAB — NOVEL CORONAVIRUS, NAA: SARS-CoV-2, NAA: NOT DETECTED

## 2019-06-10 ENCOUNTER — Encounter (HOSPITAL_COMMUNITY): Payer: Self-pay

## 2019-06-10 ENCOUNTER — Other Ambulatory Visit: Payer: Self-pay

## 2019-06-10 ENCOUNTER — Ambulatory Visit (HOSPITAL_COMMUNITY)
Admission: EM | Admit: 2019-06-10 | Discharge: 2019-06-10 | Disposition: A | Discharge status: Home / Self Care | Payer: BC Managed Care – PPO | Attending: Family Medicine | Admitting: Family Medicine

## 2019-06-10 DIAGNOSIS — M62830 Muscle spasm of back: Secondary | ICD-10-CM

## 2019-06-10 DIAGNOSIS — M545 Low back pain, unspecified: Secondary | ICD-10-CM

## 2019-06-10 MED ORDER — DICLOFENAC SODIUM 75 MG PO TBEC: 75.0000 mg | DELAYED_RELEASE_TABLET | Freq: Two times a day (BID) | ORAL | 0 refills | Status: DC

## 2019-06-10 MED ORDER — CYCLOBENZAPRINE HCL 10 MG PO TABS: ORAL_TABLET | ORAL | 0 refills | Status: DC

## 2019-06-10 NOTE — Discharge Instructions (Signed)

## 2019-06-10 NOTE — ED Triage Notes (Signed)
Pt states he has back pain. Pt states his back feels stiff x 2 days. Pt states he needs work note.

## 2019-06-12 NOTE — ED Provider Notes (Signed)
[facility]   [id number] 06/10/19 Arrival Time: 1029  ASSESSMENT & PLAN:  1. Acute bilateral low back pain without sciatica   2. Muscle spasm of back      Able to ambulate here and hemodynamically stable. No indication for imaging of back at this time given no trauma and normal neurological exam. Discussed.  Meds ordered this encounter  Medications  . cyclobenzaprine (FLEXERIL) 10 MG tablet    Sig: Take 1 tablet by mouth 3 times daily as needed for muscle spasm. Warning: May cause drowsiness.    Dispense:  21 tablet    Refill:  0  . diclofenac (VOLTAREN) 75 MG EC tablet    Sig: Take 1 tablet (75 mg total) by mouth 2 (two) times daily.    Dispense:  14 tablet    Refill:  0    Medication sedation precautions given. Encourage ROM/movement as tolerated.  Recommend: Follow-up Information     [facility]   Why: If worsening or failing to improve as anticipated. Contact information: [address] [place] [address] [phone]          Reviewed expectations re: course of current medical issues. Questions answered. Outlined signs and symptoms indicating need for more acute intervention. Patient verbalized understanding. After Visit Summary given.   SUBJECTIVE: History from: patient.  Joe [name] is a 23 y.o. male who presents with complaint of intermittent bilateral lower back discomfort. Onset gradual. First noted 1-2 d ago. Injury/trama: but questions from lifting items. History of back problems requiring medical care: occasional with similar symptoms. Pain described as aching and without radiation. Occasional sharp pain; transient; with certain movements. Pain is worse with prolonged walking/standing, worse with movements involving back, and better with rest. Overall improving. Progressive LE weakness or saddle anesthesia: none. Extremity sensation changes or weakness: none. Ambulatory without  difficulty. Normal bowel/bladder habits: yes; without urinary retention. Normal PO intake without n/v. No associated abdominal pain/n/v. Self treatment: has tried nothing for pain relief.  Reports no chronic steroid use, fevers, IV drug use, or recent back surgeries or procedures.  ROS: As per HPI. All other systems negative.   OBJECTIVE:  Vitals:   06/10/19 1121 06/10/19 1123  BP:  131/81  Pulse:  66  Resp:  18  Temp:  98.6 F (37 C)  TempSrc:  Oral  SpO2:  98%  Weight: 117.9 kg     General appearance: alert; no distress HEENT: [place]; AT Neck: supple with FROM; without midline tenderness CV: RRR Lungs: unlabored respirations; symmetrical air entry Abdomen: soft, non-tender; non-distended Back: mild  and poorly localized tenderness to palpation over bilateral lumbar musculature; FROM at waist; bruising: none; without midline tenderness Extremities: without edema; symmetrical without gross deformities; normal ROM of bilateral LE Skin: warm and dry Neurologic: normal gait; normal reflexes of bilateral LE; normal sensation of bilateral LE; normal strength of bilateral LE Psychological: alert and cooperative; normal mood and affect    Allergies  Allergen Reactions  . Shellfish Allergy     postive skin test -- only positive for food    Past Medical History:  Diagnosis Date  . Allergy   . Congenital hammer toe    Saw Dr. Lunette Stands at age 68 yrs. Rec F/u after stops growing  . OSA (obstructive sleep apnea)    Rx T and A  . Perennial allergic rhinitis 12/27/2011   Nasal saline, cetirizine, Topical nasal steroid needed at times for control  Social History   Socioeconomic History  . Marital status: Single    Spouse name: Not on file  . Number of children: Not on file  . Years of education: Not on file  . Highest education level: Not on file  Occupational History  . Not on file  Social Needs  . Financial resource strain: Not on file  . Food insecurity    Worry: Not  on file    Inability: Not on file  . Transportation needs    Medical: Not on file    Non-medical: Not on file  Tobacco Use  . Smoking status: Never Smoker  . Smokeless tobacco: Never Used  Substance and Sexual Activity  . Alcohol use: No  . Drug use: No  . Sexual activity: Never  Lifestyle  . Physical activity    Days per week: Not on file    Minutes per session: Not on file  . Stress: Not on file  Relationships  . Social Herbalist on phone: Not on file    Gets together: Not on file    Attends religious service: Not on file    Active member of club or organization: Not on file    Attends meetings of clubs or organizations: Not on file    Relationship status: Not on file  . Intimate partner violence    Fear of current or ex partner: Not on file    Emotionally abused: Not on file    Physically abused: Not on file    Forced sexual activity: Not on file  Other Topics Concern  . Not on file  Social History Narrative  . Not on file   Family History  Problem Relation Age of Onset  . Hypertension Mother   . Asthma Mother   . Diabetes Father   . Hyperlipidemia Paternal Grandmother   . Diabetes Paternal Grandmother   . Hyperlipidemia Paternal Grandfather   . Diabetes Paternal Grandfather   . Hypertension Maternal Grandmother   . Kidney disease Maternal Grandmother        from hypertension  . Hypertension Maternal Grandfather   . Kidney disease Maternal Grandfather        from hypertension   Past Surgical History:  Procedure Laterality Date  . ADENOIDECTOMY    . Evonnie Dawes, MD 06/12/19 [phone]

## 2019-06-21 ENCOUNTER — Other Ambulatory Visit: Payer: Self-pay

## 2019-06-21 DIAGNOSIS — Z20822 Contact with and (suspected) exposure to covid-19: Secondary | ICD-10-CM

## 2019-06-22 LAB — NOVEL CORONAVIRUS, NAA: SARS-CoV-2, NAA: NOT DETECTED

## 2019-07-14 ENCOUNTER — Emergency Department (HOSPITAL_COMMUNITY): Payer: BC Managed Care – PPO

## 2019-07-14 ENCOUNTER — Encounter (HOSPITAL_COMMUNITY): Payer: Self-pay | Admitting: *Deleted

## 2019-07-14 ENCOUNTER — Other Ambulatory Visit: Payer: Self-pay

## 2019-07-14 ENCOUNTER — Emergency Department (HOSPITAL_COMMUNITY)
Admission: EM | Admit: 2019-07-14 | Discharge: 2019-07-15 | Disposition: A | Discharge status: Home / Self Care | Payer: BC Managed Care – PPO | Attending: Emergency Medicine | Admitting: Emergency Medicine

## 2019-07-14 DIAGNOSIS — D708 Other neutropenia: Secondary | ICD-10-CM

## 2019-07-14 DIAGNOSIS — Z79899 Other long term (current) drug therapy: Secondary | ICD-10-CM | POA: Diagnosis not present

## 2019-07-14 DIAGNOSIS — R519 Headache, unspecified: Secondary | ICD-10-CM | POA: Insufficient documentation

## 2019-07-14 DIAGNOSIS — D709 Neutropenia, unspecified: Secondary | ICD-10-CM | POA: Diagnosis not present

## 2019-07-14 MED ORDER — METOCLOPRAMIDE HCL 5 MG/ML IJ SOLN
10.0000 mg | Freq: Once | INTRAMUSCULAR | Status: AC
  Administered 2019-07-15: 10 mg via INTRAVENOUS
  Filled 2019-07-14: qty 2

## 2019-07-14 MED ORDER — DIPHENHYDRAMINE HCL 50 MG/ML IJ SOLN
25.0000 mg | Freq: Once | INTRAMUSCULAR | Status: AC
  Administered 2019-07-15: 25 mg via INTRAVENOUS
  Filled 2019-07-14: qty 1

## 2019-07-14 MED ORDER — SODIUM CHLORIDE 0.9 % IV BOLUS
1000.0000 mL | Freq: Once | INTRAVENOUS | Status: AC
  Administered 2019-07-15: 1000 mL via INTRAVENOUS

## 2019-07-14 MED ORDER — DEXAMETHASONE SODIUM PHOSPHATE 10 MG/ML IJ SOLN
10.0000 mg | Freq: Once | INTRAMUSCULAR | Status: AC
  Administered 2019-07-15: 10 mg via INTRAVENOUS
  Filled 2019-07-14: qty 1

## 2019-07-14 NOTE — ED Provider Notes (Signed)
[facility] EMERGENCY DEPARTMENT Provider Note   CSN: [id number] Arrival date & time: 07/14/19  1936   History Chief Complaint  Patient presents with  . Headache    Joe [name] is a 23 y.o. male.  The history is provided by the patient.  Headache  He has history of concussions and comes in complaining of an occipital headache for the last 3 days.  Headache is throbbing and constant and he rates it at 4/10.  Nothing makes it better, nothing makes it worse.  It does radiate to the vertex and a little bit to the frontal area.  He does not admit to some blurring of vision but denies nausea or vomiting.  Denies photophobia or phonophobia.  He has taken acetaminophen and ibuprofen without relief.  He does not recall having had similar headaches in the past.  He also states that he feels generally weak and he has noted that he feels hot when everybody else in the room is not.  He denies fever, chills, sweats.  He denies cough or sore throat.  He denies diarrhea.  He denies exposure to COVID-19.  Past Medical History:  Diagnosis Date  . Allergy   . Congenital hammer toe    Saw Dr. Lunette StandsAnna Voytek at age 6 yrs. Rec F/u after stops growing  . OSA (obstructive sleep apnea)    Rx T and A  . Perennial allergic rhinitis 12/27/2011   Nasal saline, cetirizine, Topical nasal steroid needed at times for control    Patient Active Problem List   Diagnosis Date Noted  . BMI 28.0-28.9,adult 01/01/2014  . Keratosis pilaris 12/28/2011  . Perennial allergic rhinitis 12/27/2011  . Congenital hammer toe   . Allergy to shellfish 08/17/2011    Past Surgical History:  Procedure Laterality Date  . ADENOIDECTOMY    . TONSILLECTOMY         Family History  Problem Relation Age of Onset  . Hypertension Mother   . Asthma Mother   . Diabetes Father   . Hyperlipidemia Paternal Grandmother   . Diabetes Paternal Grandmother   . Hyperlipidemia Paternal Grandfather   . Diabetes  Paternal Grandfather   . Hypertension Maternal Grandmother   . Kidney disease Maternal Grandmother        from hypertension  . Hypertension Maternal Grandfather   . Kidney disease Maternal Grandfather        from hypertension    Social History   Tobacco Use  . Smoking status: Never Smoker  . Smokeless tobacco: Never Used  Substance Use Topics  . Alcohol use: No  . Drug use: No    Home Medications Prior to Admission medications   Medication Sig Start Date End Date Taking? Authorizing Provider  benzonatate (TESSALON) 100 MG capsule Take 1 capsule by mouth every 8 (eight) hours for cough. 08/24/18   Mardella LaymanHagler, Brian, MD  cyclobenzaprine (FLEXERIL) 10 MG tablet Take 1 tablet by mouth 3 times daily as needed for muscle spasm. Warning: May cause drowsiness. 06/10/19   Mardella LaymanHagler, Brian, MD  diclofenac (VOLTAREN) 75 MG EC tablet Take 1 tablet (75 mg total) by mouth 2 (two) times daily. 06/10/19   Mardella LaymanHagler, Brian, MD  ibuprofen (ADVIL) 800 MG tablet Take 1 tablet (800 mg total) by mouth 3 (three) times daily with meals as needed. For pain 04/10/19   Janace ArisBast, Traci A, NP    Allergies    Shellfish allergy  Review of Systems   Review of Systems  Neurological: Positive  for headaches.  All other systems reviewed and are negative.   Physical Exam Updated Vital Signs BP 134/85   Pulse 88   Temp 99.9 F (37.7 C)   Resp 18   Ht 6\' 3"  (1.905 m)   Wt 122.5 kg   SpO2 98%   BMI 33.75 kg/m   Physical Exam Vitals and nursing note reviewed.   22 year old male, resting comfortably and in no acute distress. Vital signs are normal. Oxygen saturation is 98%, which is normal. Head is normocephalic and atraumatic. PERRLA, EOMI. Oropharynx is clear.  There is tenderness palpation over the temporalis muscles bilaterally and over the insertion of the paracervical muscles bilaterally. Neck is nontender and supple without adenopathy or JVD. Back is nontender and there is no CVA tenderness. Lungs are clear  without rales, wheezes, or rhonchi. Chest is nontender. Heart has regular rate and rhythm without murmur. Abdomen is soft, flat, nontender without masses or hepatosplenomegaly and peristalsis is normoactive. Extremities have no cyanosis or edema, full range of motion is present. Skin is warm and dry without rash. Neurologic: Mental status is normal, cranial nerves are intact, there are no motor or sensory deficits.  ED Results / Procedures / Treatments   Labs (all labs ordered are listed, but only abnormal results are displayed) Labs Reviewed  CBC WITH DIFFERENTIAL/PLATELET - Abnormal; Notable for the following components:      Result Value   WBC 3.6 (*)    Neutro Abs 1.0 (*)    All other components within normal limits  COMPREHENSIVE METABOLIC PANEL  URINALYSIS, ROUTINE W REFLEX MICROSCOPIC    Radiology CT Head Wo Contrast  Result Date: 07/14/2019 CLINICAL DATA:  Posttraumatic headache, headaches for years since football injury, now with increasing frequency EXAM: CT HEAD WITHOUT CONTRAST TECHNIQUE: Contiguous axial images were obtained from the base of the skull through the vertex without intravenous contrast. COMPARISON:  None. FINDINGS: Brain: Note is made of a cavum septum pellucidum, a benign incidental finding. No evidence of acute infarction, hemorrhage, hydrocephalus, extra-axial collection or mass lesion/mass effect. Vascular: No hyperdense vessel or unexpected calcification. Skull: No calvarial fracture or suspicious osseous lesion. No scalp swelling or hematoma. Sinuses/Orbits: Paranasal sinuses and mastoid air cells are predominantly clear. Included orbital structures are unremarkable. Other: None IMPRESSION: Normal head CT. Electronically Signed   By: 07/16/2019 M.D.   On: 07/14/2019 23:20    Procedures Procedures   Medications Ordered in ED Medications  sodium chloride 0.9 % bolus 1,000 mL (1,000 mLs Intravenous New Bag/Given 07/15/19 0029)  metoCLOPramide (REGLAN)  injection 10 mg (10 mg Intravenous Given 07/15/19 0029)  diphenhydrAMINE (BENADRYL) injection 25 mg (25 mg Intravenous Given 07/15/19 0029)  dexamethasone (DECADRON) injection 10 mg (10 mg Intravenous Given 07/15/19 0029)    ED Course  I have reviewed the triage vital signs and the nursing notes.  Pertinent labs & imaging results that were available during my care of the patient were reviewed by me and considered in my medical decision making (see chart for details).  MDM Rules/Calculators/A&P Headache which seems to be muscle contraction headache.  No red flags to suggest more serious pathology.  However, since he has not had similar headaches in the past and has had trauma in the past, will send for CT of the head.  We will also be given a headache cocktail of normal saline, metoclopramide, diphenhydramine, dexamethasone.  Old records reviewed, and he has an ED visit for concussion 7 years ago, but no ED  visits for headaches.  No prior head CTs on record.  CT of head is unremarkable.  CBC does show leukopenia suggesting viral illness.  He had excellent relief of his headache with above-noted treatment.  I have offered testing for COVID-19, but patient has declined.  I have recommended that he rest, drink plenty of fluids, use over-the-counter NSAIDs and acetaminophen as needed.  Return precautions discussed.  CURTIES CONIGLIARO was evaluated in Emergency Department on 07/15/2019 for the symptoms described in the history of present illness. He was evaluated in the context of the global COVID-19 pandemic, which necessitated consideration that the patient might be at risk for infection with the SARS-CoV-2 virus that causes COVID-19. Institutional protocols and algorithms that pertain to the evaluation of patients at risk for COVID-19 are in a state of rapid change based on information released by regulatory bodies including the CDC and federal and state organizations. These policies and algorithms were  followed during the patient's care in the ED.  Final Clinical Impression(s) / ED Diagnoses Final diagnoses:  Bad headache  Other neutropenia [facility])    Rx / DC Orders ED Discharge Orders    None       Delora Fuel, MD 16/10/96 0236

## 2019-07-14 NOTE — ED Triage Notes (Signed)
The pt has had headaches for years after a football injury.  Since christmas his headaches have become more frequent. No n or v

## 2019-07-15 LAB — CBC WITH DIFFERENTIAL/PLATELET
Abs Immature Granulocytes: 0 10*3/uL (ref 0.00–0.07)
Basophils Absolute: 0 10*3/uL (ref 0.0–0.1)
Basophils Relative: 1 %
Eosinophils Absolute: 0 10*3/uL (ref 0.0–0.5)
Eosinophils Relative: 0 %
HCT: 45.3 % (ref 39.0–52.0)
Hemoglobin: 15 g/dL (ref 13.0–17.0)
Immature Granulocytes: 0 %
Lymphocytes Relative: 57 %
Lymphs Abs: 2.1 10*3/uL (ref 0.7–4.0)
MCH: 29.5 pg (ref 26.0–34.0)
MCHC: 33.1 g/dL (ref 30.0–36.0)
MCV: 89 fL (ref 80.0–100.0)
Monocytes Absolute: 0.5 10*3/uL (ref 0.1–1.0)
Monocytes Relative: 13 %
Neutro Abs: 1 10*3/uL — ABNORMAL LOW (ref 1.7–7.7)
Neutrophils Relative %: 29 %
Platelets: 218 10*3/uL (ref 150–400)
RBC: 5.09 MIL/uL (ref 4.22–5.81)
RDW: 12.3 % (ref 11.5–15.5)
WBC: 3.6 10*3/uL — ABNORMAL LOW (ref 4.0–10.5)
nRBC: 0 % (ref 0.0–0.2)

## 2019-07-15 LAB — COMPREHENSIVE METABOLIC PANEL
ALT: 26 U/L (ref 0–44)
AST: 27 U/L (ref 15–41)
Albumin: 3.9 g/dL (ref 3.5–5.0)
Alkaline Phosphatase: 68 U/L (ref 38–126)
Anion gap: 6 (ref 5–15)
BUN: 7 mg/dL (ref 6–20)
CO2: 27 mmol/L (ref 22–32)
Calcium: 8.8 mg/dL — ABNORMAL LOW (ref 8.9–10.3)
Chloride: 103 mmol/L (ref 98–111)
Creatinine, Ser: 1.05 mg/dL (ref 0.61–1.24)
GFR calc Af Amer: >60 mL/min (ref >60)
GFR calc non Af Amer: >60 mL/min (ref >60)
Glucose, Bld: 101 mg/dL — ABNORMAL HIGH (ref 70–99)
Potassium: 3.4 mmol/L — ABNORMAL LOW (ref 3.5–5.1)
Sodium: 136 mmol/L (ref 135–145)
Total Bilirubin: <0.1 mg/dL — ABNORMAL LOW (ref 0.3–1.2)
Total Protein: 7.2 g/dL (ref 6.5–8.1)

## 2019-07-15 NOTE — Discharge Instructions (Signed)
Drink plenty of fluids.  Take ibuprofen or acetaminophen as needed for pain or fever.  Return if symptoms are worsening.

## 2019-07-17 ENCOUNTER — Other Ambulatory Visit: Payer: Self-pay

## 2019-07-17 ENCOUNTER — Ambulatory Visit (HOSPITAL_COMMUNITY)
Admission: EM | Admit: 2019-07-17 | Discharge: 2019-07-17 | Disposition: A | Discharge status: Home / Self Care | Payer: BC Managed Care – PPO | Attending: Family Medicine | Admitting: Family Medicine

## 2019-07-17 ENCOUNTER — Encounter (HOSPITAL_COMMUNITY): Payer: Self-pay

## 2019-07-17 DIAGNOSIS — U071 COVID-19: Secondary | ICD-10-CM | POA: Diagnosis not present

## 2019-07-17 DIAGNOSIS — R519 Headache, unspecified: Secondary | ICD-10-CM

## 2019-07-17 LAB — POC SARS CORONAVIRUS 2 AG -  ED: SARS Coronavirus 2 Ag: POSITIVE — AB

## 2019-07-17 LAB — POC SARS CORONAVIRUS 2 AG: SARS Coronavirus 2 Ag: POSITIVE — AB

## 2019-07-17 NOTE — ED Provider Notes (Signed)
[facility] CARE CENTER   [id number] 07/17/19 Arrival Time: 1247  ASSESSMENT & PLAN:  1. Bad headache   2. COVID-19 virus infection     COVID-19 test positive. See AVS for d/c instructions.  Normal neurological exam. No signs of meningitis. Discussed. May f/u here as needed. Work note with self-isolation instructions given.  Reviewed expectations re: course of current medical issues. Questions answered. Outlined signs and symptoms indicating need for more acute intervention. Patient verbalized understanding. After Visit Summary given.   SUBJECTIVE: History from: Patient. Patient is able to give a clear and coherent history. Seen in ED on 07/14/2019; note and imaging reviewed by me. Normal head CT.  Joe [name] is a 23 y.o. male who presents with complaint of an occipital headache. Onset gradual, first noted on 07/12/2019.  Describes as dull and non-radiating currently. No significant worsening since ED evaluation; 'just still there'. History of headaches requiring medical evaluation: none reported. Precipitating factors include: none which have been determined. Associated symptoms: Preceding aura: no. Nausea/vomiting: no. Vision changes: no. Increased sensitivity to light and to noises: no. Fever: no. Sinus pressure/congestion: no. Extremity weakness: no. Home treatment has included occasional OTC analgesic with little improvement. Current headache has not limited normal daily activities. Does affect sleep. Denies depression, dizziness, loss of balance, muscle weakness, numbness of extremities, speech difficulties and vision problems. No head injury reported. Ambulatory without difficulty. No recent travel. No confusion reported. No specific neck pain reported.  ROS: As per HPI. All other systems negative.    OBJECTIVE:  Vitals:   07/17/19 1315  BP: (!) 145/79  Pulse: (!) 102  Resp: 18  Temp: (!) 100.7 F (38.2 C)  TempSrc: Oral  SpO2: 97%    General  appearance: alert; NAD HENT: normocephalic; atraumatic Eyes: PERRLA; EOMI; conjunctivae normal Neck: supple with FROM; without nuchal rigidity Lungs: clear to auscultation bilaterally; unlabored respirations Heart: regular; slight tachycardia Extremities: no edema; symmetrical with no gross deformities Skin: warm and dry Neurologic: alert; speech is fluent and clear without dysarthria or aphasia; CN 2-12 grossly intact; no facial droop; normal gait; normal symmetric reflexes; normal extremity strength and sensation throughout; bilateral upper and lower extremity sensation is grossly intact with 5/5 symmetric strength; normal grip strength; normal plantar and dorsi flexion bilaterally; normal finger to nose bilaterally; negative pronator drift Psychological: alert and cooperative; normal mood and affect  Labs: Labs Reviewed  POC SARS CORONAVIRUS 2 AG -  ED     Allergies  Allergen Reactions  . Shellfish Allergy     postive skin test -- only positive for food    Past Medical History:  Diagnosis Date  . Allergy   . Congenital hammer toe    Saw Dr. Lunette Stands at age 31 yrs. Rec F/u after stops growing  . OSA (obstructive sleep apnea)    Rx T and A  . Perennial allergic rhinitis 12/27/2011   Nasal saline, cetirizine, Topical nasal steroid needed at times for control   Social History   Socioeconomic History  . Marital status: Single    Spouse name: Not on file  . Number of children: Not on file  . Years of education: Not on file  . Highest education level: Not on file  Occupational History  . Not on file  Tobacco Use  . Smoking status: Never Smoker  . Smokeless tobacco: Never Used  Substance and Sexual Activity  . Alcohol use: No  . Drug use: No  . Sexual activity: Never  Other Topics  Concern  . Not on file  Social History Narrative  . Not on file   Social Determinants of Health   Financial Resource Strain:   . Difficulty of Paying Living Expenses: Not on file  Food  Insecurity:   . Worried About Charity fundraiser in the Last Year: Not on file  . Ran Out of Food in the Last Year: Not on file  Transportation Needs:   . Lack of Transportation (Medical): Not on file  . Lack of Transportation (Non-Medical): Not on file  Physical Activity:   . Days of Exercise per Week: Not on file  . Minutes of Exercise per Session: Not on file  Stress:   . Feeling of Stress : Not on file  Social Connections:   . Frequency of Communication with Friends and Family: Not on file  . Frequency of Social Gatherings with Friends and Family: Not on file  . Attends Religious Services: Not on file  . Active Member of Clubs or Organizations: Not on file  . Attends Archivist Meetings: Not on file  . Marital Status: Not on file  Intimate Partner Violence:   . Fear of Current or Ex-Partner: Not on file  . Emotionally Abused: Not on file  . Physically Abused: Not on file  . Sexually Abused: Not on file   Family History  Problem Relation Age of Onset  . Hypertension Mother   . Asthma Mother   . Diabetes Father   . Hyperlipidemia Paternal Grandmother   . Diabetes Paternal Grandmother   . Hyperlipidemia Paternal Grandfather   . Diabetes Paternal Grandfather   . Hypertension Maternal Grandmother   . Kidney disease Maternal Grandmother        from hypertension  . Hypertension Maternal Grandfather   . Kidney disease Maternal Grandfather        from hypertension   Past Surgical History:  Procedure Laterality Date  . ADENOIDECTOMY    . Evonnie Dawes, MD 07/17/19 1420

## 2019-07-17 NOTE — ED Triage Notes (Signed)
Pt has had a headache since 07/12/19.pt has taking OTC medication with no relief.

## 2019-07-23 ENCOUNTER — Ambulatory Visit: Payer: BC Managed Care – PPO | Attending: Internal Medicine

## 2019-07-23 DIAGNOSIS — Z20822 Contact with and (suspected) exposure to covid-19: Secondary | ICD-10-CM

## 2019-07-25 LAB — NOVEL CORONAVIRUS, NAA: SARS-CoV-2, NAA: NOT DETECTED

## 2019-08-23 ENCOUNTER — Encounter (HOSPITAL_COMMUNITY): Payer: Self-pay

## 2019-08-23 ENCOUNTER — Other Ambulatory Visit: Payer: Self-pay

## 2019-08-23 ENCOUNTER — Ambulatory Visit (HOSPITAL_COMMUNITY)
Admission: EM | Admit: 2019-08-23 | Discharge: 2019-08-23 | Disposition: A | Discharge status: Home / Self Care | Payer: BC Managed Care – PPO | Attending: Family Medicine | Admitting: Family Medicine

## 2019-08-23 DIAGNOSIS — Z202 Contact with and (suspected) exposure to infections with a predominantly sexual mode of transmission: Secondary | ICD-10-CM | POA: Diagnosis present

## 2019-08-23 DIAGNOSIS — Z113 Encounter for screening for infections with a predominantly sexual mode of transmission: Secondary | ICD-10-CM | POA: Diagnosis present

## 2019-08-23 LAB — HIV ANTIBODY (ROUTINE TESTING W REFLEX): HIV Screen 4th Generation wRfx: NONREACTIVE

## 2019-08-23 MED ORDER — AZITHROMYCIN 250 MG PO TABS
1000.0000 mg | ORAL_TABLET | Freq: Once | ORAL | Status: AC
  Administered 2019-08-23: 1000 mg via ORAL

## 2019-08-23 MED ORDER — AZITHROMYCIN 250 MG PO TABS
ORAL_TABLET | ORAL | Status: AC
  Filled 2019-08-23: qty 4

## 2019-08-23 NOTE — Discharge Instructions (Signed)
We have treated you today for chlamydia, with azithromycin. Please refrain from sexual activity for 7 days while medicine is clearing infection. ° °We are testing you for HIV, Syphillis, Gonorrhea, Chlamydia and Trichomonas. We will call you if anything is positive and let you know if you require any further treatment. Please inform partner of any positive results. ° °Please return if symptoms not improving with treatment, development of fever, nausea, vomiting, abdominal pain, scrotal pain. °

## 2019-08-23 NOTE — ED Triage Notes (Signed)
Patient presents to Urgent Care with complaints of exposure to chlamydia since unknown.. Patient denies burning but states it feels weird.

## 2019-08-23 NOTE — ED Provider Notes (Signed)
[facility]    CSN: [id number] Arrival date & time: 08/23/19  9323      History   Chief Complaint Chief Complaint  Patient presents with  . Exposure to STD    HPI Joe [name] is a 24 y.o. male no contributing past medical history presenting today for evaluation of STD exposure.  Patient states that her partner tested positive for chlamydia recently.  He does report some tingling with urination, denies any known discharge.  He is concerned as they both were previously treated.  He reports that he refrain from intercourse for the full week including oral.  Denies rashes or lesions.  Would like to proceed with HIV and syphilis testing as well today.  HPI  Past Medical History:  Diagnosis Date  . Allergy   . Congenital hammer toe    Saw Dr. Lunette Stands at age 77 yrs. Rec F/u after stops growing  . OSA (obstructive sleep apnea)    Rx T and A  . Perennial allergic rhinitis 12/27/2011   Nasal saline, cetirizine, Topical nasal steroid needed at times for control    Patient Active Problem List   Diagnosis Date Noted  . BMI 28.0-28.9,adult 01/01/2014  . Keratosis pilaris 12/28/2011  . Perennial allergic rhinitis 12/27/2011  . Congenital hammer toe   . Allergy to shellfish 08/17/2011    Past Surgical History:  Procedure Laterality Date  . ADENOIDECTOMY    . TONSILLECTOMY         Home Medications    Prior to Admission medications   Not on File    Family History Family History  Problem Relation Age of Onset  . Hypertension Mother   . Asthma Mother   . Diabetes Father   . Hyperlipidemia Paternal Grandmother   . Diabetes Paternal Grandmother   . Hyperlipidemia Paternal Grandfather   . Diabetes Paternal Grandfather   . Hypertension Maternal Grandmother   . Kidney disease Maternal Grandmother        from hypertension  . Hypertension Maternal Grandfather   . Kidney disease Maternal Grandfather        from hypertension    Social History Social  History   Tobacco Use  . Smoking status: Never Smoker  . Smokeless tobacco: Never Used  Substance Use Topics  . Alcohol use: No    Comment: occ  . Drug use: No     Allergies   Shellfish allergy   Review of Systems Review of Systems  Constitutional: Negative for fever.  HENT: Negative for sore throat.   Respiratory: Negative for shortness of breath.   Cardiovascular: Negative for chest pain.  Gastrointestinal: Negative for abdominal pain, nausea and vomiting.  Genitourinary: Positive for dysuria. Negative for difficulty urinating, discharge, frequency, penile pain, penile swelling, scrotal swelling and testicular pain.  Skin: Negative for rash.  Neurological: Negative for dizziness, light-headedness and headaches.     Physical Exam Triage Vital Signs ED Triage Vitals  Enc Vitals Group     BP 08/23/19 1043 (!) 148/73     Pulse Rate 08/23/19 1043 75     Resp 08/23/19 1043 18     Temp 08/23/19 1043 98.4 F (36.9 C)     Temp Source 08/23/19 1043 Oral     SpO2 08/23/19 1043 96 %     Weight --      Height --      Head Circumference --      Peak Flow --      Pain Score  08/23/19 1040 0     Pain Loc --      Pain Edu? --      Excl. in [place]? --    No data found.  Updated Vital Signs BP (!) 148/73 (BP Location: Left Arm)   Pulse 75   Temp 98.4 F (36.9 C) (Oral)   Resp 18   SpO2 96%   Visual Acuity Right Eye Distance:   Left Eye Distance:   Bilateral Distance:    Right Eye Near:   Left Eye Near:    Bilateral Near:     Physical Exam Vitals and nursing note reviewed.  Constitutional:      Appearance: He is well-developed.     Comments: No acute distress  HENT:     Head: Normocephalic and atraumatic.     Nose: Nose normal.  Eyes:     Conjunctiva/sclera: Conjunctivae normal.  Cardiovascular:     Rate and Rhythm: Normal rate.  Pulmonary:     Effort: Pulmonary effort is normal. No respiratory distress.  Abdominal:     General: There is no distension.    Genitourinary:    Comments: Normal penile shaft, circumcised, no rashes or lesions noted, small amount of whitish-yellowish discharge noted within urethra Musculoskeletal:        General: Normal range of motion.     Cervical back: Neck supple.  Skin:    General: Skin is warm and dry.  Neurological:     Mental Status: He is alert and oriented to person, place, and time.      UC Treatments / Results  Labs (all labs ordered are listed, but only abnormal results are displayed) Labs Reviewed  HIV ANTIBODY (ROUTINE TESTING W REFLEX)  RPR  CYTOLOGY, (ORAL, ANAL, URETHRAL) ANCILLARY ONLY    EKG   Radiology No results found.  Procedures Procedures (including critical care time)  Medications Ordered in UC Medications  azithromycin (ZITHROMAX) tablet 1,000 mg (has no administration in time range)    Initial Impression / Assessment and Plan / UC Course  I have reviewed the triage vital signs and the nursing notes.  Pertinent labs & imaging results that were available during my care of the patient were reviewed by me and considered in my medical decision making (see chart for details).     We will empirically treat for chlamydia today, urethral swab obtained to send off to check for gonorrhea, chlamydia and trichomonas.  Also obtaining HIV and RPR.  Will provide further treatment based off results.  Discussed strict return precautions. Patient verbalized understanding and is agreeable with plan.  Final Clinical Impressions(s) / UC Diagnoses   Final diagnoses:  Exposure to STD  Screen for STD (sexually transmitted disease)     Discharge Instructions     We have treated you today for chlamydia, with azithromycin. Please refrain from sexual activity for 7 days while medicine is clearing infection.  We are testing you for HIV, Syphillis, Gonorrhea, Chlamydia and Trichomonas. We will call you if anything is positive and let you know if you require any further treatment.  Please inform partner of any positive results.  Please return if symptoms not improving with treatment, development of fever, nausea, vomiting, abdominal pain, scrotal pain.   ED Prescriptions    None     PDMP not reviewed this encounter.   Janith Lima, PA-C 08/23/19 1110

## 2019-08-24 LAB — RPR: RPR Ser Ql: NONREACTIVE

## 2019-08-26 ENCOUNTER — Telehealth (HOSPITAL_COMMUNITY): Payer: Self-pay | Admitting: Emergency Medicine

## 2019-08-26 LAB — CYTOLOGY, (ORAL, ANAL, URETHRAL) ANCILLARY ONLY
Chlamydia: POSITIVE — AB
Neisseria Gonorrhea: NEGATIVE
Trichomonas: NEGATIVE

## 2019-08-26 NOTE — Telephone Encounter (Signed)
Chlamydia is positive.  This was treated at the urgent care visit with po zithromax 1g.  Please refrain from sexual intercourse for 7 days to give the medicine time to work.  Sexual partners need to be notified and tested/treated.  Condoms may reduce risk of reinfection.  Recheck or followup with PCP for further evaluation if symptoms are not improving.  [facility] notified.  Patient contacted by phone and made aware of    results. Pt verbalized understanding and had all questions answered.    

## 2020-03-21 IMAGING — CT CT HEAD W/O CM
4 series · 16 of 47 positions shown, 18 images · non-contrast
Comparison: None.

CLINICAL DATA: Posttraumatic headache, headaches for years since
football injury, now with increasing frequency

EXAM:
CT HEAD WITHOUT CONTRAST
TECHNIQUE: Contiguous axial images were obtained from the base of the skull
through the vertex without intravenous contrast.

[Series 2: head without · axial · non-contrast · 0.46mm/px · z∈[-95,+45]mm · 7 of 38 slices shown, 9 images]
[im 5/38  brain]
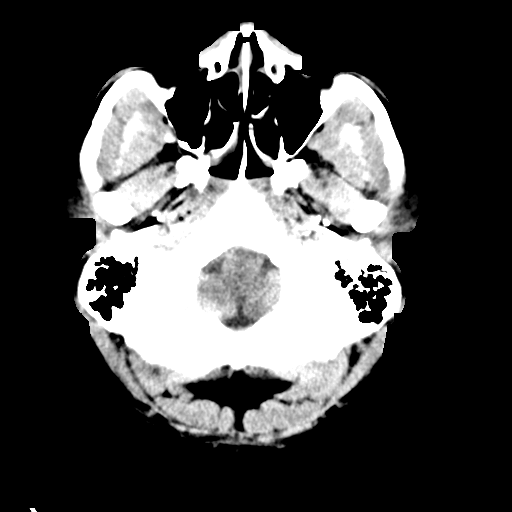
[im 5/38  bone]
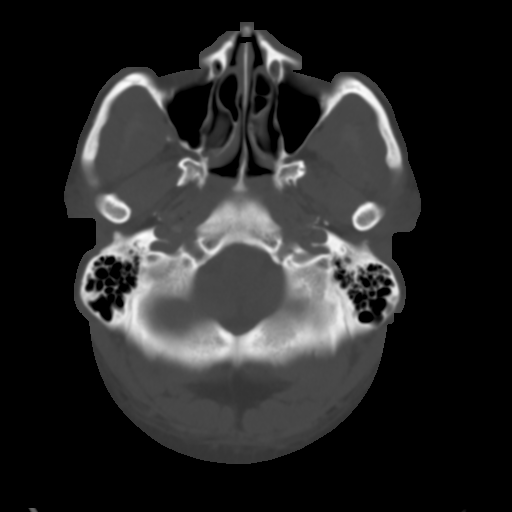
[im 10/38  brain]
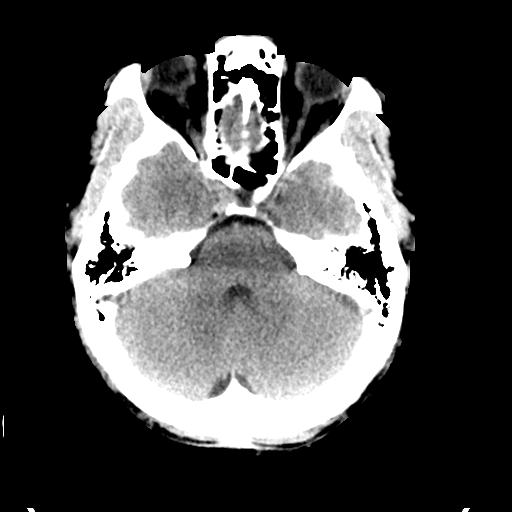
[im 14/38  brain]
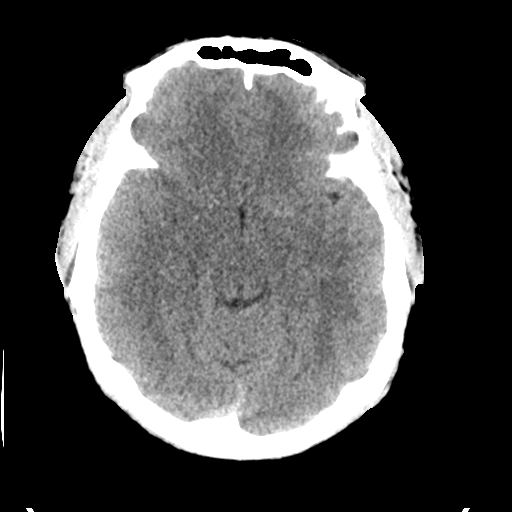
[im 19/38  brain]
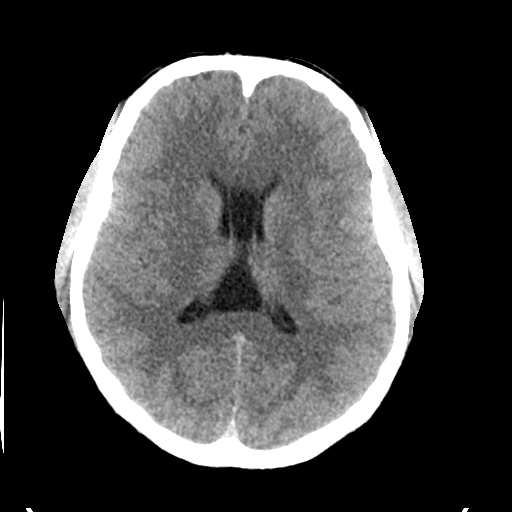
[im 24/38  brain]
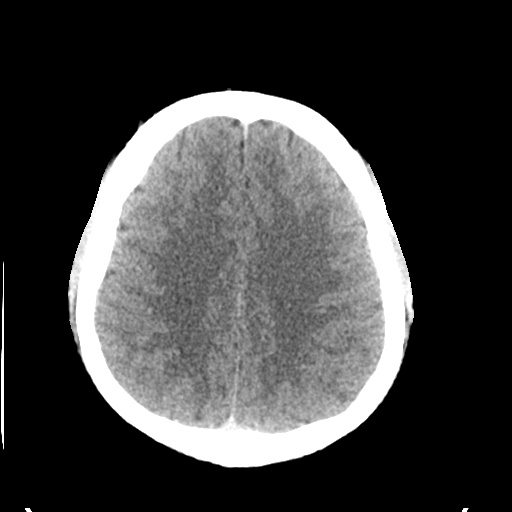
[im 24/38  bone]
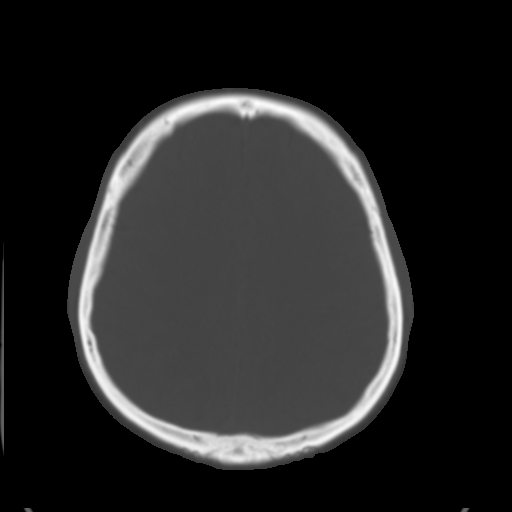
[im 28/38  brain]
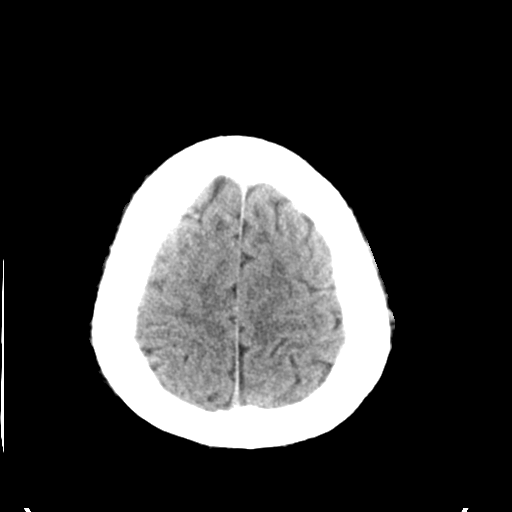
[im 33/38  brain]
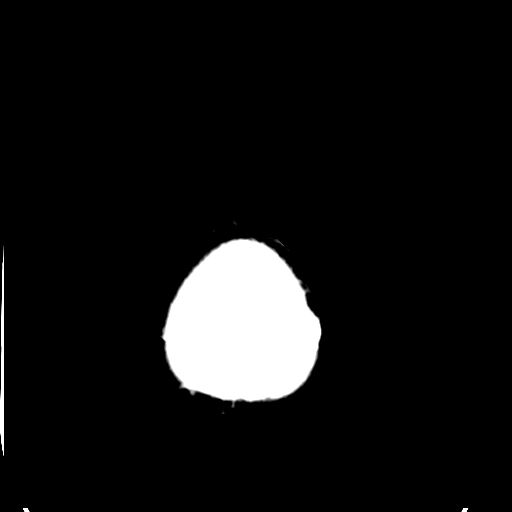

[Series 3: head bone · axial · 0.46mm/px · z∈[-97,-61]mm · 3 of 94 slices shown]
[im 10/94  bone]
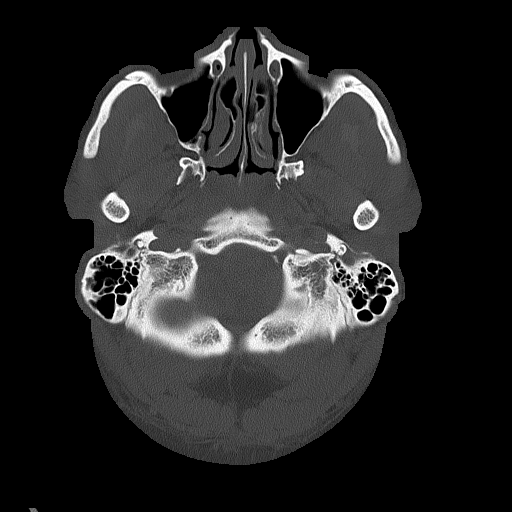
[im 19/94  bone]
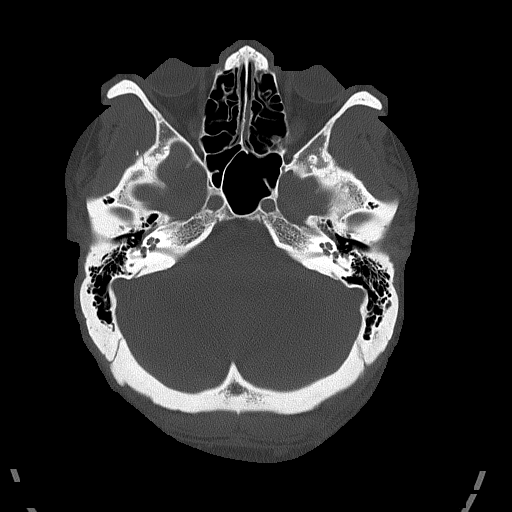
[im 28/94  bone]
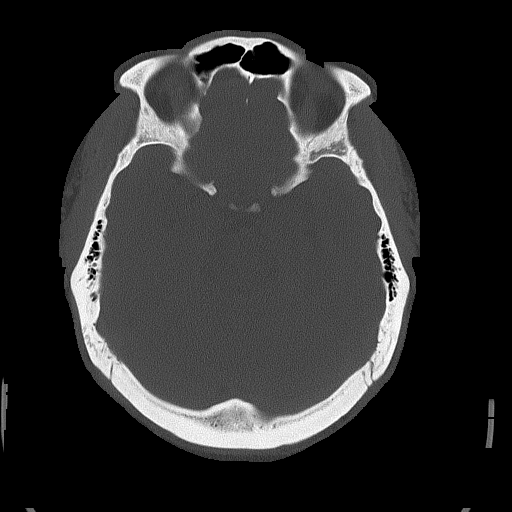

[Series 4: head without cor · coronal · non-contrast · 0.37mm/px · 3 of 77 slices shown]
[im 26/77  brain]
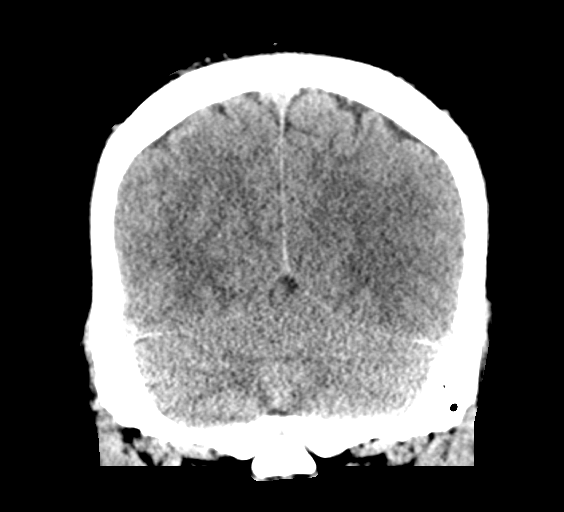
[im 34/77  brain]
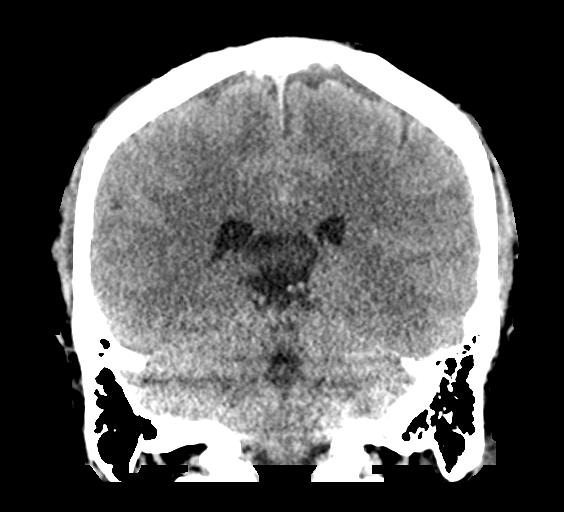
[im 43/77  brain]
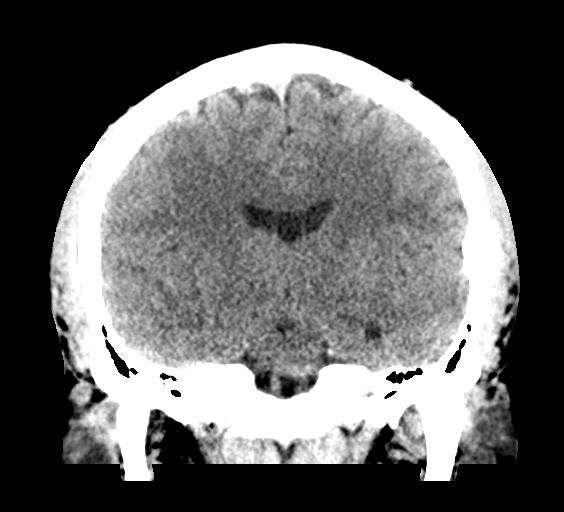

[Series 5: head without sag · sagittal · non-contrast · 0.37mm/px · 3 of 69 slices shown]
[im 23/69  brain]
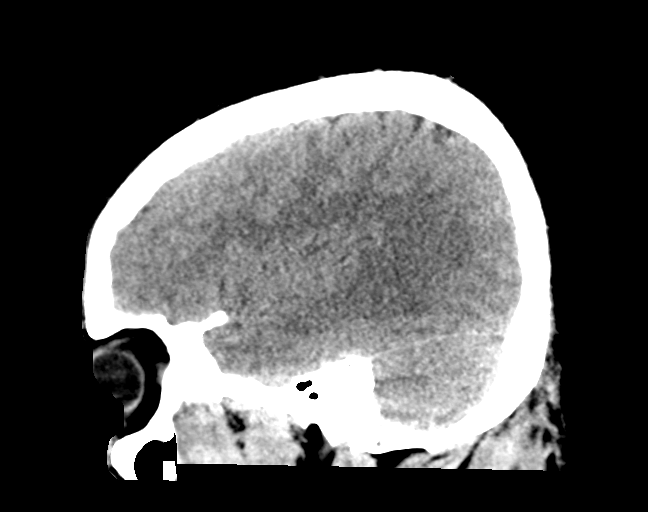
[im 35/69  brain]
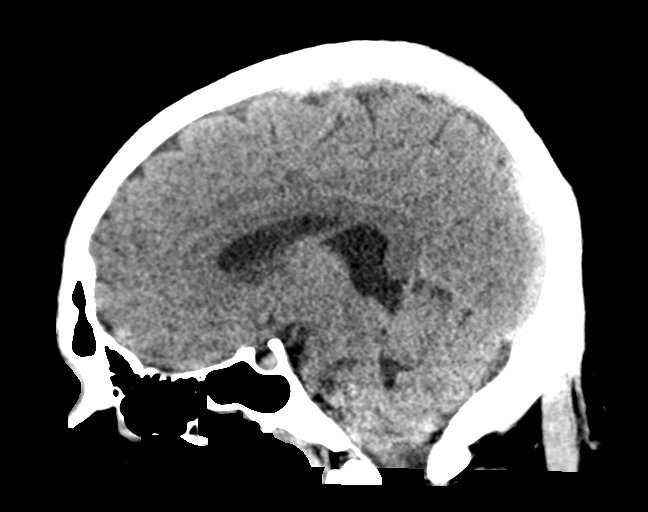
[im 46/69  brain]
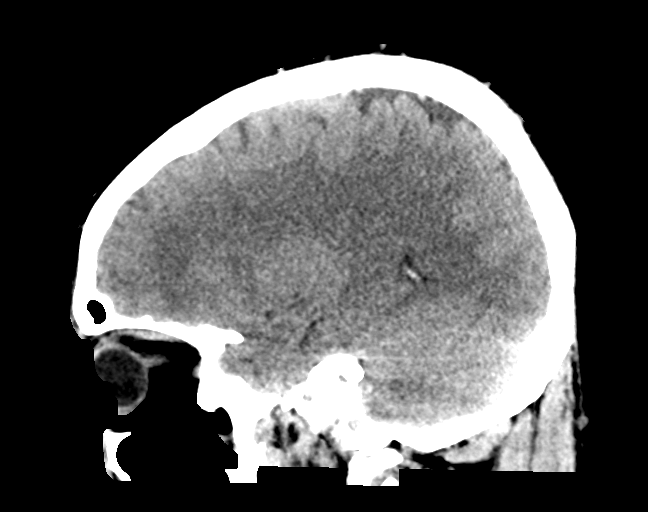

[16 of 47 positions shown; findings below may reference images not displayed]

FINDINGS: Brain: Note is made of a cavum septum pellucidum, a benign
incidental finding. No evidence of acute infarction, hemorrhage,
hydrocephalus, extra-axial collection or mass lesion/mass effect.

Vascular: No hyperdense vessel or unexpected calcification.

Skull: No calvarial fracture or suspicious osseous lesion. No scalp
swelling or hematoma.

Sinuses/Orbits: Paranasal sinuses and mastoid air cells are
predominantly clear. Included orbital structures are unremarkable.

Other: None
IMPRESSION: Normal head CT.

## 2020-04-01 ENCOUNTER — Ambulatory Visit (HOSPITAL_COMMUNITY)
Admission: EM | Admit: 2020-04-01 | Discharge: 2020-04-01 | Disposition: A | Discharge status: Home / Self Care | Payer: BC Managed Care – PPO | Attending: Family Medicine | Admitting: Family Medicine

## 2020-04-01 ENCOUNTER — Other Ambulatory Visit: Payer: Self-pay

## 2020-04-01 ENCOUNTER — Encounter (HOSPITAL_COMMUNITY): Payer: Self-pay | Admitting: Emergency Medicine

## 2020-04-01 DIAGNOSIS — R369 Urethral discharge, unspecified: Secondary | ICD-10-CM

## 2020-04-01 MED ORDER — AZITHROMYCIN 250 MG PO TABS
ORAL_TABLET | ORAL | Status: AC
  Filled 2020-04-01: qty 4

## 2020-04-01 MED ORDER — AZITHROMYCIN 250 MG PO TABS
1000.0000 mg | ORAL_TABLET | Freq: Once | ORAL | Status: AC
  Administered 2020-04-01: 1000 mg via ORAL

## 2020-04-01 NOTE — Discharge Instructions (Addendum)
Treating you for chlamydia Swab sent for testing.

## 2020-04-01 NOTE — ED Triage Notes (Signed)
Pt c/o cloudy penile discharge onset last night. Pt states his partner states she was positive for BV and a yeast infection.

## 2020-04-02 LAB — CYTOLOGY, (ORAL, ANAL, URETHRAL) ANCILLARY ONLY
Chlamydia: POSITIVE — AB
Comment: NEGATIVE
Comment: NEGATIVE
Comment: NORMAL
Neisseria Gonorrhea: NEGATIVE
Trichomonas: NEGATIVE

## 2020-04-02 NOTE — ED Provider Notes (Signed)
[facility]    CSN: [id number] Arrival date & time: 04/01/20  1000      History   Chief Complaint Chief Complaint  Patient presents with  . SEXUALLY TRANSMITTED DISEASE    HPI DANYL DEEMS is a 24 y.o. male.   Patient is a 24 year old male presents today for possible STD.  Reporting cloudy, penile discharge.  History of chlamydia.  Reporting partner recently tested positive for BV and yeast.  No testicle pain, swelling.  Mild dysuria.     Past Medical History:  Diagnosis Date  . Allergy   . Congenital hammer toe    Saw Dr. Lunette Stands at age 55 yrs. Rec F/u after stops growing  . OSA (obstructive sleep apnea)    Rx T and A  . Perennial allergic rhinitis 12/27/2011   Nasal saline, cetirizine, Topical nasal steroid needed at times for control    Patient Active Problem List   Diagnosis Date Noted  . BMI 28.0-28.9,adult 01/01/2014  . Keratosis pilaris 12/28/2011  . Perennial allergic rhinitis 12/27/2011  . Congenital hammer toe   . Allergy to shellfish 08/17/2011    Past Surgical History:  Procedure Laterality Date  . ADENOIDECTOMY    . TONSILLECTOMY         Home Medications    Prior to Admission medications   Not on File    Family History Family History  Problem Relation Age of Onset  . Hypertension Mother   . Asthma Mother   . Diabetes Father   . Hyperlipidemia Paternal Grandmother   . Diabetes Paternal Grandmother   . Hyperlipidemia Paternal Grandfather   . Diabetes Paternal Grandfather   . Hypertension Maternal Grandmother   . Kidney disease Maternal Grandmother        from hypertension  . Hypertension Maternal Grandfather   . Kidney disease Maternal Grandfather        from hypertension    Social History Social History   Tobacco Use  . Smoking status: Never Smoker  . Smokeless tobacco: Never Used  Vaping Use  . Vaping Use: Every day  . Substances: Nicotine, CBD, Flavoring  Substance Use Topics  . Alcohol use: No     Comment: occ  . Drug use: No     Allergies   Shellfish allergy   Review of Systems Review of Systems   Physical Exam Triage Vital Signs ED Triage Vitals  Enc Vitals Group     BP 04/01/20 1225 136/88     Pulse Rate 04/01/20 1225 77     Resp 04/01/20 1225 17     Temp 04/01/20 1225 99.1 F (37.3 C)     Temp Source 04/01/20 1225 Oral     SpO2 04/01/20 1225 97 %     Weight --      Height --      Head Circumference --      Peak Flow --      Pain Score 04/01/20 1223 0     Pain Loc --      Pain Edu? --      Excl. in GC? --    No data found.  Updated Vital Signs BP 136/88 (BP Location: Left Arm)   Pulse 77   Temp 99.1 F (37.3 C) (Oral)   Resp 17   SpO2 97%   Visual Acuity Right Eye Distance:   Left Eye Distance:   Bilateral Distance:    Right Eye Near:   Left Eye Near:  Bilateral Near:     Physical Exam Vitals and nursing note reviewed.  Constitutional:      Appearance: Normal appearance.  HENT:     Head: Normocephalic and atraumatic.     Nose: Nose normal.  Eyes:     Conjunctiva/sclera: Conjunctivae normal.  Pulmonary:     Effort: Pulmonary effort is normal.  Musculoskeletal:        General: Normal range of motion.     Cervical back: Normal range of motion.  Skin:    General: Skin is warm and dry.  Neurological:     Mental Status: He is alert.  Psychiatric:        Mood and Affect: Mood normal.      UC Treatments / Results  Labs (all labs ordered are listed, but only abnormal results are displayed) Labs Reviewed  CYTOLOGY, (ORAL, ANAL, URETHRAL) ANCILLARY ONLY    EKG   Radiology No results found.  Procedures Procedures (including critical care time)  Medications Ordered in UC Medications  azithromycin (ZITHROMAX) tablet 1,000 mg (1,000 mg Oral Given 04/01/20 1253)    Initial Impression / Assessment and Plan / UC Course  I have reviewed the triage vital signs and the nursing notes.  Pertinent labs & imaging results that were  available during my care of the patient were reviewed by me and considered in my medical decision making (see chart for details).     Penile discharge Treating for chlamydia based on symptoms and history. Swab sent for testing  Final Clinical Impressions(s) / UC Diagnoses   Final diagnoses:  Penile discharge     Discharge Instructions     Treating you for chlamydia Swab sent for testing.      ED Prescriptions    None     PDMP not reviewed this encounter.   Janace Aris, NP 04/02/20 [phone]

## 2020-07-17 ENCOUNTER — Emergency Department (HOSPITAL_BASED_OUTPATIENT_CLINIC_OR_DEPARTMENT_OTHER)
Admission: EM | Admit: 2020-07-17 | Discharge: 2020-07-17 | Disposition: A | Discharge status: Home / Self Care | Payer: BC Managed Care – PPO | Attending: Emergency Medicine | Admitting: Emergency Medicine

## 2020-07-17 ENCOUNTER — Other Ambulatory Visit: Payer: Self-pay

## 2020-07-17 ENCOUNTER — Emergency Department (HOSPITAL_BASED_OUTPATIENT_CLINIC_OR_DEPARTMENT_OTHER): Payer: BC Managed Care – PPO

## 2020-07-17 DIAGNOSIS — R0789 Other chest pain: Secondary | ICD-10-CM | POA: Insufficient documentation

## 2020-07-17 DIAGNOSIS — H538 Other visual disturbances: Secondary | ICD-10-CM | POA: Diagnosis not present

## 2020-07-17 DIAGNOSIS — R519 Headache, unspecified: Secondary | ICD-10-CM | POA: Diagnosis not present

## 2020-07-17 DIAGNOSIS — R079 Chest pain, unspecified: Secondary | ICD-10-CM | POA: Diagnosis present

## 2020-07-17 LAB — BASIC METABOLIC PANEL
Anion gap: 9 (ref 5–15)
BUN: 14 mg/dL (ref 6–20)
CO2: 24 mmol/L (ref 22–32)
Calcium: 9 mg/dL (ref 8.9–10.3)
Chloride: 103 mmol/L (ref 98–111)
Creatinine, Ser: 1.08 mg/dL (ref 0.61–1.24)
GFR, Estimated: >60 mL/min (ref >60)
Glucose, Bld: 112 mg/dL — ABNORMAL HIGH (ref 70–99)
Potassium: 3.3 mmol/L — ABNORMAL LOW (ref 3.5–5.1)
Sodium: 136 mmol/L (ref 135–145)

## 2020-07-17 LAB — CBG MONITORING, ED: Glucose-Capillary: 105 mg/dL — ABNORMAL HIGH (ref 70–99)

## 2020-07-17 LAB — CBC
HCT: 41.2 % (ref 39.0–52.0)
Hemoglobin: 14.1 g/dL (ref 13.0–17.0)
MCH: 30.3 pg (ref 26.0–34.0)
MCHC: 34.2 g/dL (ref 30.0–36.0)
MCV: 88.4 fL (ref 80.0–100.0)
Platelets: 244 10*3/uL (ref 150–400)
RBC: 4.66 MIL/uL (ref 4.22–5.81)
RDW: 12.9 % (ref 11.5–15.5)
WBC: 4.3 10*3/uL (ref 4.0–10.5)
nRBC: 0 % (ref 0.0–0.2)

## 2020-07-17 LAB — TROPONIN I (HIGH SENSITIVITY)
Troponin I (High Sensitivity): 2 ng/L (ref <18)
Troponin I (High Sensitivity): 2 ng/L (ref <18)

## 2020-07-17 MED ORDER — KETOROLAC TROMETHAMINE 15 MG/ML IJ SOLN
15.0000 mg | Freq: Once | INTRAMUSCULAR | Status: AC
  Administered 2020-07-17: 15 mg via INTRAVENOUS
  Filled 2020-07-17: qty 1

## 2020-07-17 NOTE — ED Provider Notes (Signed)
[facility] Provider Note: Lowella Dell, MD, FACEP  CSN: [id number] MRN: [id number] ARRIVAL: 07/17/20 at 0333 ROOM: [facility]14/[facility]   CHIEF COMPLAINT  Chest Pain   HISTORY OF PRESENT ILLNESS  07/17/20 4:13 AM Joe [name] is a 24 y.o. male with chest pain that began about an hour ago.  He initially thought it was indigestion but now describes it as tightness or pulling sensation.  It is present across his left lower chest around to the back.  He also feels a burning sensation in his left arm and movement of the left arm causes a pulling sensation in his left chest at the location of his chest pain.  He denies shortness of breath, nausea, vomiting or diaphoresis.  He rates the pain is a 10 out of 10.  He also states his vision has been blurred and he has had a headache during the same time period.    Past Medical History:  Diagnosis Date  . Allergy   . Congenital hammer toe    Saw Dr. Lunette Stands at age 72 yrs. Rec F/u after stops growing  . OSA (obstructive sleep apnea)    Rx T and A  . Perennial allergic rhinitis 12/27/2011   Nasal saline, cetirizine, Topical nasal steroid needed at times for control    Past Surgical History:  Procedure Laterality Date  . ADENOIDECTOMY    . TONSILLECTOMY      Family History  Problem Relation Age of Onset  . Hypertension Mother   . Asthma Mother   . Diabetes Father   . Hyperlipidemia Paternal Grandmother   . Diabetes Paternal Grandmother   . Hyperlipidemia Paternal Grandfather   . Diabetes Paternal Grandfather   . Hypertension Maternal Grandmother   . Kidney disease Maternal Grandmother        from hypertension  . Hypertension Maternal Grandfather   . Kidney disease Maternal Grandfather        from hypertension    Social History   Tobacco Use  . Smoking status: Never Smoker  . Smokeless tobacco: Never Used  Vaping Use  . Vaping Use: Every day  . Substances: Nicotine, CBD, Flavoring  Substance Use Topics  .  Alcohol use: No    Comment: occ  . Drug use: No    Prior to Admission medications   Not on File    Allergies Shellfish allergy   REVIEW OF SYSTEMS  Negative except as noted here or in the History of Present Illness.   PHYSICAL EXAMINATION  Initial Vital Signs Blood pressure (!) 145/86, pulse 89, temperature 98.7 F (37.1 C), resp. rate 18, height 6\' 3"  (1.905 m), weight 124.7 kg, SpO2 95 %.  Examination General: Well-developed, well-nourished male in no acute distress; appearance consistent with age of record HENT: normocephalic; atraumatic Eyes: pupils equal, round and reactive to light; extraocular muscles intact Neck: supple Heart: regular rate and rhythm Lungs: clear to auscultation bilaterally Chest: Tenderness of left lower chest around back Abdomen: soft; nondistended; nontender; bowel sounds present Extremities: No deformity; full range of motion; pulses normal; movement of left arm exacerbates chest pain Neurologic: Awake, alert and oriented; motor function intact in all extremities and symmetric; no facial droop Skin: Warm and dry Psychiatric: Normal mood and affect   RESULTS  Summary of this visit's results, reviewed and interpreted by myself:   EKG Interpretation  Date/Time:  Friday July 17 2020 03:49:19 EST Ventricular Rate:  89 PR Interval:  174 QRS Duration: 94 QT Interval:  342 QTC Calculation: 416 R Axis:   12 Text Interpretation: Normal sinus rhythm ST elevations Confirmed by Carrol Hougland ([id number]) on 07/17/2020 3:59:19 AM      Laboratory Studies: Results for orders placed or performed during the hospital encounter of 07/17/20 (from the past 24 hour(s))  Basic metabolic panel     Status: Abnormal   Collection Time: 07/17/20  3:51 AM  Result Value Ref Range   Sodium 136 135 - 145 mmol/L   Potassium 3.3 (L) 3.5 - 5.1 mmol/L   Chloride 103 98 - 111 mmol/L   CO2 24 22 - 32 mmol/L   Glucose, Bld 112 (H) 70 - 99 mg/dL   BUN 14 6 - 20 mg/dL    Creatinine, Ser 2.45 0.61 - 1.24 mg/dL   Calcium 9.0 8.9 - 80.9 mg/dL   GFR, Estimated >98 >33 mL/min   Anion gap 9 5 - 15  CBC     Status: None   Collection Time: 07/17/20  3:51 AM  Result Value Ref Range   WBC 4.3 4.0 - 10.5 K/uL   RBC 4.66 4.22 - 5.81 MIL/uL   Hemoglobin 14.1 13.0 - 17.0 g/dL   HCT 82.5 05.3 - 97.6 %   MCV 88.4 80.0 - 100.0 fL   MCH 30.3 26.0 - 34.0 pg   MCHC 34.2 30.0 - 36.0 g/dL   RDW 73.4 19.3 - 79.0 %   Platelets 244 150 - 400 K/uL   nRBC 0.0 0.0 - 0.2 %  Troponin I (High Sensitivity)     Status: None   Collection Time: 07/17/20  3:51 AM  Result Value Ref Range   Troponin I (High Sensitivity) 2 <18 ng/L  CBG monitoring, ED     Status: Abnormal   Collection Time: 07/17/20  4:33 AM  Result Value Ref Range   Glucose-Capillary 105 (H) 70 - 99 mg/dL  Troponin I (High Sensitivity)     Status: None   Collection Time: 07/17/20  6:16 AM  Result Value Ref Range   Troponin I (High Sensitivity) 2 <18 ng/L   Imaging Studies: DG Chest 2 View  Result Date: 07/17/2020 CLINICAL DATA:  Chest pain EXAM: CHEST - 2 VIEW COMPARISON:  07/28/2018 FINDINGS: The heart size and mediastinal contours are within normal limits. Both lungs are clear. The visualized skeletal structures are unremarkable. IMPRESSION: No active cardiopulmonary disease. Electronically Signed   By: Deatra Robinson M.D.   On: 07/17/2020 04:30    ED COURSE and MDM  Nursing notes, initial and subsequent vitals signs, including pulse oximetry, reviewed and interpreted by myself.  Vitals:   07/17/20 0500 07/17/20 0530 07/17/20 0600 07/17/20 0630  BP: (!) 147/86 129/74 131/71 119/64  Pulse: 80 75    Resp: 20 17 17 16   Temp:      SpO2: 97% 96% 96% 96%  Weight:      Height:       Medications  ketorolac (TORADOL) 15 MG/ML injection 15 mg (15 mg Intravenous Given 07/17/20 0435)   6:49 AM Patient's chest pain is atypical and reproducible.  His troponins are normal with no delta.  He is pain-free now  after IV Toradol.   PROCEDURES  Procedures   ED DIAGNOSES     ICD-10-CM   1. Chest wall pain  R07.89        09-25-1987, MD 07/17/20 [phone]

## 2020-10-11 ENCOUNTER — Encounter (HOSPITAL_COMMUNITY): Payer: Self-pay | Admitting: *Deleted

## 2020-10-11 ENCOUNTER — Telehealth (HOSPITAL_COMMUNITY): Payer: Self-pay | Admitting: Emergency Medicine

## 2020-10-11 ENCOUNTER — Ambulatory Visit (INDEPENDENT_AMBULATORY_CARE_PROVIDER_SITE_OTHER): Payer: BC Managed Care – PPO

## 2020-10-11 ENCOUNTER — Other Ambulatory Visit: Payer: Self-pay

## 2020-10-11 ENCOUNTER — Ambulatory Visit (HOSPITAL_COMMUNITY)
Admission: EM | Admit: 2020-10-11 | Discharge: 2020-10-11 | Disposition: A | Discharge status: Home / Self Care | Payer: BC Managed Care – PPO | Attending: Internal Medicine | Admitting: Internal Medicine

## 2020-10-11 DIAGNOSIS — M25561 Pain in right knee: Secondary | ICD-10-CM

## 2020-10-11 DIAGNOSIS — W19XXXA Unspecified fall, initial encounter: Secondary | ICD-10-CM | POA: Diagnosis not present

## 2020-10-11 NOTE — ED Triage Notes (Signed)
[place] and [place] his [place].

## 2020-10-11 NOTE — Discharge Instructions (Signed)
[facility] Address: [address] [place], [place], [place] [address] Hours:  Open ? Closes 9PM

## 2020-10-11 NOTE — ED Provider Notes (Signed)
[facility]    CSN: [id number] Arrival date & time: 10/11/20  1138      History   Chief Complaint Chief Complaint  Patient presents with  . Knee Pain    RT    HPI Joe [name] is a 25 y.o. male.   Patient presenting today with acute onset right knee pain that started yesterday while he was at a trampoline park with his daughter.  He states he was trying to move did not land on a child that had fallen under him and landed on the leg wrong, hearing a pop in the knee.  He has very minimal mobility and has severe pain anteriorly.  Denies significant swelling, discoloration, numbness, tingling, history of knee injuries.  Has been trying to ice it and keep still with minimal relief.     Past Medical History:  Diagnosis Date  . Allergy   . Congenital hammer toe    Saw Dr. Lunette Stands at age 79 yrs. Rec F/u after stops growing  . OSA (obstructive sleep apnea)    Rx T and A  . Perennial allergic rhinitis 12/27/2011   Nasal saline, cetirizine, Topical nasal steroid needed at times for control    Patient Active Problem List   Diagnosis Date Noted  . BMI 28.0-28.9,adult 01/01/2014  . Keratosis pilaris 12/28/2011  . Perennial allergic rhinitis 12/27/2011  . Congenital hammer toe   . Allergy to shellfish 08/17/2011    Past Surgical History:  Procedure Laterality Date  . ADENOIDECTOMY    . TONSILLECTOMY         Home Medications    Prior to Admission medications   Not on File    Family History Family History  Problem Relation Age of Onset  . Hypertension Mother   . Asthma Mother   . Diabetes Father   . Hyperlipidemia Paternal Grandmother   . Diabetes Paternal Grandmother   . Hyperlipidemia Paternal Grandfather   . Diabetes Paternal Grandfather   . Hypertension Maternal Grandmother   . Kidney disease Maternal Grandmother        from hypertension  . Hypertension Maternal Grandfather   . Kidney disease Maternal Grandfather        from hypertension     Social History Social History   Tobacco Use  . Smoking status: Never Smoker  . Smokeless tobacco: Never Used  Vaping Use  . Vaping Use: Every day  . Substances: Nicotine, CBD, Flavoring  Substance Use Topics  . Alcohol use: No    Comment: occ  . Drug use: No     Allergies   Shellfish allergy   Review of Systems Review of Systems Per HPI  Physical Exam Triage Vital Signs ED Triage Vitals [10/11/20 1148]  Enc Vitals Group     BP 139/88     Pulse Rate 81     Resp 18     Temp 98.8 F (37.1 C)     Temp Source Oral     SpO2 98 %     Weight      Height      Head Circumference      Peak Flow      Pain Score 5     Pain Loc      Pain Edu?      Excl. in GC?    No data found.  Updated Vital Signs BP 139/88 (BP Location: Left Arm)   Pulse 81   Temp 98.8 F (37.1 C) (Oral)  Resp 18   SpO2 98%   Visual Acuity Right Eye Distance:   Left Eye Distance:   Bilateral Distance:    Right Eye Near:   Left Eye Near:    Bilateral Near:     Physical Exam Vitals and nursing note reviewed.  Constitutional:      Appearance: Normal appearance.  HENT:     Head: Atraumatic.  Eyes:     Extraocular Movements: Extraocular movements intact.     Conjunctiva/sclera: Conjunctivae normal.  Cardiovascular:     Rate and Rhythm: Normal rate and regular rhythm.  Pulmonary:     Effort: Pulmonary effort is normal.     Breath sounds: Normal breath sounds.  Musculoskeletal:     Cervical back: Normal range of motion and neck supple.     Comments: Minimal range of motion right knee.  Significant pain with drawer testing.  Negative McMurray's testing  Skin:    General: Skin is warm and dry.     Findings: No bruising or erythema.  Neurological:     General: No focal deficit present.     Mental Status: He is oriented to person, place, and time.     Sensory: No sensory deficit.     Gait: Gait abnormal.     Comments: Limping gait from severe knee pain right leg  Psychiatric:         Mood and Affect: Mood normal.        Thought Content: Thought content normal.        Judgment: Judgment normal.     UC Treatments / Results  Labs (all labs ordered are listed, but only abnormal results are displayed) Labs Reviewed - No data to display  EKG   Radiology DG Knee Complete 4 Views Right  Result Date: 10/11/2020 CLINICAL DATA:  Knee injury jumping yesterday EXAM: RIGHT KNEE - COMPLETE 4+ VIEW COMPARISON:  None. FINDINGS: Suspect a deep lateral femoral notch sign on lateral view. No overt fracture. Joint spaces are preserved. Soft tissue edema anteriorly. IMPRESSION: 1. Suspected lateral femoral notch sign, concerning for internal derangement of the knee, specifically ACL tear. Consider MRI to further evaluate. 2. No macroscopic fracture or dislocation. The joint space is well preserved. Electronically Signed   By: Lauralyn Primes M.D.   On: 10/11/2020 12:09    Procedures Procedures (including critical care time)  Medications Ordered in UC Medications - No data to display  Initial Impression / Assessment and Plan / UC Course  I have reviewed the triage vital signs and the nursing notes.  Pertinent labs & imaging results that were available during my care of the patient were reviewed by me and considered in my medical decision making (see chart for details).    Significant decreased mobility, severe pain.  X-ray today per radiology showing concern for ACL tear.  MRI recommended for follow-up.  Information given for [facility] urgent care, [place] location is open today until 9 PM.  He will get a ride over there for further evaluation.  Placed in a knee immobilizer and given crutches today.   Per radiologist-see below IMPRESSION: 1. Suspected lateral femoral notch sign, concerning for internal derangement of the knee, specifically ACL tear. Consider MRI to further evaluate. Final Clinical Impressions(s) / UC Diagnoses   Final diagnoses:  Acute pain of right  knee     Discharge Instructions     [facility] Address: [address] [place], [place], [place] [address] Hours:  Open ? Closes 9PM  ED Prescriptions    None     PDMP not reviewed this encounter.   Particia Nearing, [place] 10/11/20 1233

## 2021-03-08 ENCOUNTER — Ambulatory Visit (HOSPITAL_COMMUNITY)
Admission: EM | Admit: 2021-03-08 | Discharge: 2021-03-08 | Disposition: A | Discharge status: Home / Self Care | Payer: BC Managed Care – PPO | Attending: Student | Admitting: Student

## 2021-03-08 ENCOUNTER — Other Ambulatory Visit: Payer: Self-pay

## 2021-03-08 ENCOUNTER — Encounter (HOSPITAL_COMMUNITY): Payer: Self-pay

## 2021-03-08 DIAGNOSIS — Z202 Contact with and (suspected) exposure to infections with a predominantly sexual mode of transmission: Secondary | ICD-10-CM

## 2021-03-08 DIAGNOSIS — Z113 Encounter for screening for infections with a predominantly sexual mode of transmission: Secondary | ICD-10-CM | POA: Diagnosis not present

## 2021-03-08 DIAGNOSIS — Z8619 Personal history of other infectious and parasitic diseases: Secondary | ICD-10-CM | POA: Diagnosis not present

## 2021-03-08 LAB — HIV ANTIBODY (ROUTINE TESTING W REFLEX): HIV Screen 4th Generation wRfx: NONREACTIVE

## 2021-03-08 MED ORDER — DOXYCYCLINE HYCLATE 100 MG PO CAPS: 100.0000 mg | ORAL_CAPSULE | Freq: Two times a day (BID) | ORAL | 0 refills | Status: AC

## 2021-03-08 MED ORDER — CEFTRIAXONE SODIUM 500 MG IJ SOLR
INTRAMUSCULAR | Status: AC
  Filled 2021-03-08: qty 500

## 2021-03-08 MED ORDER — CEFTRIAXONE SODIUM 500 MG IJ SOLR
500.0000 mg | Freq: Once | INTRAMUSCULAR | Status: AC
  Administered 2021-03-08: 500 mg via INTRAMUSCULAR

## 2021-03-08 MED ORDER — LIDOCAINE HCL (PF) 1 % IJ SOLN
INTRAMUSCULAR | Status: AC
  Filled 2021-03-08: qty 2

## 2021-03-08 MED ORDER — METRONIDAZOLE 500 MG PO TABS: 500.0000 mg | ORAL_TABLET | Freq: Two times a day (BID) | ORAL | 0 refills | Status: DC

## 2021-03-08 NOTE — Discharge Instructions (Addendum)
-  We treated for gonorrhea with a shot of antibiotic called Rocephin -For chalmydia- Doxycycline twice daily for 7 days.  Make sure to wear sunscreen while spending time outside while on this medication as it can increase your chance of sunburn. You can take this medication with food if you have a sensitive stomach. -For trichomonas, start the antibiotic-Flagyl (metronidazole), 2 pills daily for 7 days.  You can take this with food if you have a sensitive stomach.  Avoid alcohol while taking this medication and for 2 days after as this will cause severe nausea and vomiting. -We're also testing for HIV and syphilis. We'll call in about 2-3 days if any of your labs are positive.  -Abstain from intercourse until treatment is complete.

## 2021-03-08 NOTE — ED Notes (Signed)
Cytology sample in lab

## 2021-03-08 NOTE — ED Triage Notes (Signed)
Pt presents for STD's test. Denies penile discharge, dysuria.  

## 2021-03-08 NOTE — ED Provider Notes (Signed)
[facility]    CSN: [id number] Arrival date & time: 03/08/21  1415      History   Chief Complaint Chief Complaint  Patient presents with   STD's test    HPI Joe [name] is a 25 y.o. male presenting for STI test- asymptomatic, but unprotected intercourse with new partner. Medical history chlamydia 02/2020. Denies hematuria, dysuria, frequency, urgency, back pain, n/v/d/abd pain, fevers/chills, abdnormal penile discharge, penile lesions, penile/testicular pain/swelling.    HPI  Past Medical History:  Diagnosis Date   Allergy    Congenital hammer toe    Saw Dr. Lunette Stands at age 76 yrs. Rec F/u after stops growing   OSA (obstructive sleep apnea)    Rx T and A   Perennial allergic rhinitis 12/27/2011   Nasal saline, cetirizine, Topical nasal steroid needed at times for control    Patient Active Problem List   Diagnosis Date Noted   BMI 28.0-28.9,adult 01/01/2014   Keratosis pilaris 12/28/2011   Perennial allergic rhinitis 12/27/2011   Congenital hammer toe    Allergy to shellfish 08/17/2011    Past Surgical History:  Procedure Laterality Date   ADENOIDECTOMY     TONSILLECTOMY         Home Medications    Prior to Admission medications   Medication Sig Start Date End Date Taking? Authorizing Provider  doxycycline (VIBRAMYCIN) 100 MG capsule Take 1 capsule (100 mg total) by mouth 2 (two) times daily for 7 days. 03/08/21 03/15/21 Yes Rhys Martini, PA-C  metroNIDAZOLE (FLAGYL) 500 MG tablet Take 1 tablet (500 mg total) by mouth 2 (two) times daily. Avoid alcohol while taking this medication and for 2 days after 03/08/21  Yes Rhys Martini, PA-C    Family History Family History  Problem Relation Age of Onset   Hypertension Mother    Asthma Mother    Diabetes Father    Hyperlipidemia Paternal Grandmother    Diabetes Paternal Grandmother    Hyperlipidemia Paternal Grandfather    Diabetes Paternal Grandfather    Hypertension Maternal  Grandmother    Kidney disease Maternal Grandmother        from hypertension   Hypertension Maternal Grandfather    Kidney disease Maternal Grandfather        from hypertension    Social History Social History   Tobacco Use   Smoking status: Never   Smokeless tobacco: Never  Vaping Use   Vaping Use: Every day   Substances: Nicotine, CBD, Flavoring  Substance Use Topics   Alcohol use: No    Comment: occ   Drug use: No     Allergies   Shellfish allergy   Review of Systems Review of Systems  Constitutional:  Negative for appetite change, chills, diaphoresis and fever.  Respiratory:  Negative for shortness of breath.   Cardiovascular:  Negative for chest pain.  Gastrointestinal:  Negative for abdominal pain, blood in stool, constipation, diarrhea, nausea and vomiting.  Genitourinary:  Negative for decreased urine volume, difficulty urinating, dysuria, flank pain, frequency, genital sores, hematuria, penile discharge, penile pain, penile swelling, scrotal swelling, testicular pain and urgency.  Musculoskeletal:  Negative for back pain.  Neurological:  Negative for dizziness, weakness and light-headedness.  All other systems reviewed and are negative.   Physical Exam Triage Vital Signs ED Triage Vitals  Enc Vitals Group     BP 03/08/21 1511 (!) 141/89     Pulse Rate 03/08/21 1511 71     Resp 03/08/21 1511 18  Temp 03/08/21 1511 98.4 F (36.9 C)     Temp Source 03/08/21 1511 Oral     SpO2 03/08/21 1511 98 %     Weight --      Height --      Head Circumference --      Peak Flow --      Pain Score 03/08/21 1510 0     Pain Loc --      Pain Edu? --      Excl. in GC? --    No data found.  Updated Vital Signs BP (!) 141/89 (BP Location: Right Arm)   Pulse 71   Temp 98.4 F (36.9 C) (Oral)   Resp 18   SpO2 98%   Visual Acuity Right Eye Distance:   Left Eye Distance:   Bilateral Distance:    Right Eye Near:   Left Eye Near:    Bilateral Near:      Physical Exam Vitals reviewed.  Constitutional:      General: He is not in acute distress.    Appearance: Normal appearance. He is not ill-appearing.  HENT:     Head: Normocephalic and atraumatic.     Mouth/Throat:     Mouth: Mucous membranes are moist.     Comments: Moist mucous membranes Eyes:     Extraocular Movements: Extraocular movements intact.     Pupils: Pupils are equal, round, and reactive to light.  Cardiovascular:     Rate and Rhythm: Normal rate and regular rhythm.     Heart sounds: Normal heart sounds.  Pulmonary:     Effort: Pulmonary effort is normal.     Breath sounds: Normal breath sounds. No wheezing, rhonchi or rales.  Abdominal:     General: Bowel sounds are normal. There is no distension.     Palpations: Abdomen is soft. There is no mass.     Tenderness: There is no abdominal tenderness. There is no right CVA tenderness, left CVA tenderness, guarding or rebound.  Genitourinary:    Comments: deferred Skin:    General: Skin is warm.     Capillary Refill: Capillary refill takes less than 2 seconds.     Comments: Good skin turgor  Neurological:     General: No focal deficit present.     Mental Status: He is alert and oriented to person, place, and time.  Psychiatric:        Mood and Affect: Mood normal.        Behavior: Behavior normal.     UC Treatments / Results  Labs (all labs ordered are listed, but only abnormal results are displayed) Labs Reviewed  RPR  HIV ANTIBODY (ROUTINE TESTING W REFLEX)  CYTOLOGY, (ORAL, ANAL, URETHRAL) ANCILLARY ONLY    EKG   Radiology No results found.  Procedures Procedures (including critical care time)  Medications Ordered in UC Medications  cefTRIAXone (ROCEPHIN) injection 500 mg (has no administration in time range)    Initial Impression / Assessment and Plan / UC Course  I have reviewed the triage vital signs and the nursing notes.  Pertinent labs & imaging results that were available during my  care of the patient were reviewed by me and considered in my medical decision making (see chart for details).     This patient is a very pleasant 25 y.o. year old male presenting with concern for STI. Afebrile, nontachycardic, no reproducible abd pain or CVAT.   Will send self-swab for G/C, trich. Also sent for HIV, RPR. Safe  sex precautions.  Medical history chlamydia 02/2020.  Patient wishes to proceed with treatment for G/C/trich today. Rocephin administered, and doxy/flagyl sent.   ED return precautions discussed. Patient verbalizes understanding and agreement.   Coding Level 4 for review of past notes/labs, order and interpretation of labs today, and prescription drug management  Final Clinical Impressions(s) / UC Diagnoses   Final diagnoses:  Routine screening for STI (sexually transmitted infection)  History of chlamydia     Discharge Instructions      -We treated for gonorrhea with a shot of antibiotic called Rocephin -For chalmydia- Doxycycline twice daily for 7 days.  Make sure to wear sunscreen while spending time outside while on this medication as it can increase your chance of sunburn. You can take this medication with food if you have a sensitive stomach. -For trichomonas, start the antibiotic-Flagyl (metronidazole), 2 pills daily for 7 days.  You can take this with food if you have a sensitive stomach.  Avoid alcohol while taking this medication and for 2 days after as this will cause severe nausea and vomiting. -We're also testing for HIV and syphilis. We'll call in about 2-3 days if any of your labs are positive.  -Abstain from intercourse until treatment is complete.       ED Prescriptions     Medication Sig Dispense Auth. Provider   doxycycline (VIBRAMYCIN) 100 MG capsule Take 1 capsule (100 mg total) by mouth 2 (two) times daily for 7 days. 14 capsule Rhys Martini, PA-C   metroNIDAZOLE (FLAGYL) 500 MG tablet Take 1 tablet (500 mg total) by mouth 2 (two)  times daily. Avoid alcohol while taking this medication and for 2 days after 14 tablet Rhys Martini, PA-C      PDMP not reviewed this encounter.   Rhys Martini, PA-C 03/08/21 [phone]

## 2021-03-09 LAB — CYTOLOGY, (ORAL, ANAL, URETHRAL) ANCILLARY ONLY
Chlamydia: NEGATIVE
Comment: NEGATIVE
Comment: NEGATIVE
Comment: NORMAL
Neisseria Gonorrhea: NEGATIVE
Trichomonas: NEGATIVE

## 2021-03-09 LAB — RPR: RPR Ser Ql: NONREACTIVE

## 2021-04-15 ENCOUNTER — Encounter (HOSPITAL_COMMUNITY): Payer: Self-pay | Admitting: Emergency Medicine

## 2021-04-15 ENCOUNTER — Ambulatory Visit (HOSPITAL_COMMUNITY)
Admission: EM | Admit: 2021-04-15 | Discharge: 2021-04-15 | Disposition: A | Discharge status: Home / Self Care | Payer: BC Managed Care – PPO | Attending: Internal Medicine | Admitting: Internal Medicine

## 2021-04-15 ENCOUNTER — Other Ambulatory Visit: Payer: Self-pay

## 2021-04-15 DIAGNOSIS — H1032 Unspecified acute conjunctivitis, left eye: Secondary | ICD-10-CM

## 2021-04-15 MED ORDER — ERYTHROMYCIN 5 MG/GM OP OINT: TOPICAL_OINTMENT | OPHTHALMIC | 0 refills | Status: DC

## 2021-04-15 NOTE — ED Triage Notes (Signed)
Patient reports he thought he had a eyelash in left eye.  Reports he may have rubbed it too much.  Patient is also concerned for brown speck in corner of left eye.  Symptoms started on sunday

## 2021-04-15 NOTE — Discharge Instructions (Addendum)
Please use medications as prescribed. If symptoms worsen please return to urgent care to be reevaluated. 

## 2021-04-17 NOTE — ED Provider Notes (Signed)
[facility]    CSN: [id number] Arrival date & time: 04/15/21  1327      History   Chief Complaint Chief Complaint  Patient presents with   Eye Problem    HPI Joe [name] is a 25 y.o. male comes to the urgent care with sensation of foreign body in his left eye.  Symptoms started a few days ago and has been persistent.  It is associated with some discharge from the inner part of the left eye.  The discharge is purulent looking.  No fever or chills.  No blurry vision.  No double vision.  No nausea or vomiting. HPI  Past Medical History:  Diagnosis Date   Allergy    Congenital hammer toe    Saw Dr. Lunette Stands at age 69 yrs. Rec F/u after stops growing   OSA (obstructive sleep apnea)    Rx T and A   Perennial allergic rhinitis 12/27/2011   Nasal saline, cetirizine, Topical nasal steroid needed at times for control    Patient Active Problem List   Diagnosis Date Noted   BMI 28.0-28.9,adult 01/01/2014   Keratosis pilaris 12/28/2011   Perennial allergic rhinitis 12/27/2011   Congenital hammer toe    Allergy to shellfish 08/17/2011    Past Surgical History:  Procedure Laterality Date   ADENOIDECTOMY     TONSILLECTOMY         Home Medications    Prior to Admission medications   Medication Sig Start Date End Date Taking? Authorizing Provider  erythromycin ophthalmic ointment Place a 1/2 inch ribbon of ointment into the lower eyelid. 04/15/21  Yes Joe [name], Joe Mccreedy, MD    Family History Family History  Problem Relation Age of Onset   Hypertension Mother    Asthma Mother    Diabetes Father    Hyperlipidemia Paternal Grandmother    Diabetes Paternal Grandmother    Hyperlipidemia Paternal Grandfather    Diabetes Paternal Grandfather    Hypertension Maternal Grandmother    Kidney disease Maternal Grandmother        from hypertension   Hypertension Maternal Grandfather    Kidney disease Maternal Grandfather        from hypertension    Social  History Social History   Tobacco Use   Smoking status: Never   Smokeless tobacco: Never  Vaping Use   Vaping Use: Every day   Substances: Nicotine, CBD, Flavoring  Substance Use Topics   Alcohol use: No    Comment: occ   Drug use: No     Allergies   Shellfish allergy   Review of Systems Review of Systems  Constitutional: Negative.   Eyes:  Positive for discharge, redness and itching. Negative for photophobia, pain and visual disturbance.  Genitourinary: Negative.   Neurological: Negative.     Physical Exam Triage Vital Signs ED Triage Vitals  Enc Vitals Group     BP 04/15/21 1459 134/85     Pulse Rate 04/15/21 1459 83     Resp 04/15/21 1459 20     Temp 04/15/21 1459 98.4 F (36.9 C)     Temp Source 04/15/21 1459 Oral     SpO2 04/15/21 1459 95 %     Weight --      Height --      Head Circumference --      Peak Flow --      Pain Score 04/15/21 1457 4     Pain Loc --      Pain  Edu? --      Excl. in GC? --    No data found.  Updated Vital Signs BP 134/85 (BP Location: Right Arm) Comment (BP Location): large cuff  Pulse 83   Temp 98.4 F (36.9 C) (Oral)   Resp 20   SpO2 95%   Visual Acuity Right Eye Distance: 20/20 Left Eye Distance: 20/20 Bilateral Distance: 20/20  Right Eye Near:   Left Eye Near:    Bilateral Near:     Physical Exam Vitals and nursing note reviewed.  Eyes:     Extraocular Movements: Extraocular movements intact.     Comments: Left eye erythema.     UC Treatments / Results  Labs (all labs ordered are listed, but only abnormal results are displayed) Labs Reviewed - No data to display  EKG   Radiology No results found.  Procedures Procedures (including critical care time)  Medications Ordered in UC Medications - No data to display  Initial Impression / Assessment and Plan / UC Course  I have reviewed the triage vital signs and the nursing notes.  Pertinent labs & imaging results that were available during my  care of the patient were reviewed by me and considered in my medical decision making (see chart for details).     1.  Acute bacterial conjunctivitis involving the left eye: Erythromycin eye ointment Return to urgent care if symptoms worsen.  Final Clinical Impressions(s) / UC Diagnoses   Final diagnoses:  Acute bacterial conjunctivitis of left eye     Discharge Instructions      Please use medications as prescribed If symptoms worsen please return to urgent care to be reevaluated.   ED Prescriptions     Medication Sig Dispense Auth. Provider   erythromycin ophthalmic ointment Place a 1/2 inch ribbon of ointment into the lower eyelid. 3.5 g Joe [name], Joe Mccreedy, MD      PDMP not reviewed this encounter.   Joe Jansky, MD 04/17/21 1600

## 2021-07-29 ENCOUNTER — Other Ambulatory Visit: Payer: Self-pay

## 2021-07-29 ENCOUNTER — Encounter (HOSPITAL_COMMUNITY): Payer: Self-pay | Admitting: Emergency Medicine

## 2021-07-29 ENCOUNTER — Ambulatory Visit (HOSPITAL_COMMUNITY)
Admission: EM | Admit: 2021-07-29 | Discharge: 2021-07-29 | Disposition: A | Discharge status: Home / Self Care | Payer: BC Managed Care – PPO | Attending: Family Medicine | Admitting: Family Medicine

## 2021-07-29 DIAGNOSIS — Z113 Encounter for screening for infections with a predominantly sexual mode of transmission: Secondary | ICD-10-CM | POA: Diagnosis not present

## 2021-07-29 DIAGNOSIS — R519 Headache, unspecified: Secondary | ICD-10-CM | POA: Diagnosis present

## 2021-07-29 LAB — HIV ANTIBODY (ROUTINE TESTING W REFLEX): HIV Screen 4th Generation wRfx: NONREACTIVE

## 2021-07-29 MED ORDER — TIZANIDINE HCL 4 MG PO TABS: 4.0000 mg | ORAL_TABLET | Freq: Three times a day (TID) | ORAL | 0 refills | Status: DC | PRN

## 2021-07-29 NOTE — ED Triage Notes (Signed)
Headache today started 2 months ago per patient.  Reports vision gets blurry "for a second" intermittently.   Std screening is routine check, no symptoms

## 2021-07-29 NOTE — ED Provider Notes (Addendum)
[facility]    CSN: [id number] Arrival date & time: 07/29/21  1133      History   Chief Complaint Chief Complaint  Patient presents with   Headache   SEXUALLY TRANSMITTED DISEASE    HPI Joe [name] is a 26 y.o. male.    Headache Here for a headache that he has had for years.  It is in his occipital area mostly.  Lately he has had a few brief moments of blurry vision that will clear easily.  Also he has had a few episodes of feeling off balance, mainly when he is arising from a sitting position.  No recent fever or chills, or upper respiratory symptoms.  No vomiting or diarrhea.  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 Almedia Balls at age 14 yrs. Rec F/u after stops growing   OSA (obstructive sleep apnea)    Rx T and A   Perennial allergic rhinitis 12/27/2011   Nasal saline, cetirizine, Topical nasal steroid needed at times for control    Patient Active Problem List   Diagnosis Date Noted   BMI 28.0-28.9,adult 01/01/2014   Keratosis pilaris 12/28/2011   Perennial allergic rhinitis 12/27/2011   Congenital hammer toe    Allergy to shellfish 08/17/2011    Past Surgical History:  Procedure Laterality Date   ADENOIDECTOMY     TONSILLECTOMY         Home Medications    Prior to Admission medications   Medication Sig Start Date End Date Taking? Authorizing Provider  tiZANidine (ZANAFLEX) 4 MG tablet Take 1 tablet (4 mg total) by mouth every 8 (eight) hours as needed for muscle spasms. 07/29/21  Yes Barrett Henle, MD    Family History Family History  Problem Relation Age of Onset   Hypertension Mother    Asthma Mother    Diabetes Father    Hyperlipidemia Paternal Grandmother    Diabetes Paternal Grandmother    Hyperlipidemia Paternal Grandfather    Diabetes Paternal Grandfather    Hypertension Maternal Grandmother    Kidney  disease Maternal Grandmother        from hypertension   Hypertension Maternal Grandfather    Kidney disease Maternal Grandfather        from hypertension    Social History Social History   Tobacco Use   Smoking status: Never   Smokeless tobacco: Never  Vaping Use   Vaping Use: Former   Substances: Nicotine, CBD, Flavoring  Substance Use Topics   Alcohol use: No    Comment: occ   Drug use: No     Allergies   Shellfish allergy   Review of Systems Review of Systems  Neurological:  Positive for headaches.    Physical Exam Triage Vital Signs ED Triage Vitals  Enc Vitals Group     BP 07/29/21 1326 124/82     Pulse Rate 07/29/21 1326 70     Resp 07/29/21 1326 (!) 22     Temp 07/29/21 1326 98.4 F (36.9 C)     Temp Source 07/29/21 1326 Oral     SpO2 07/29/21 1326 98 %     Weight --      Height --      Head Circumference --      Peak Flow --      Pain Score  07/29/21 1324 4     Pain Loc --      Pain Edu? --      Excl. in [place]? --    No data found.  Updated Vital Signs BP 124/82 (BP Location: Right Arm) Comment: large cuff   Pulse 70    Temp 98.4 F (36.9 C) (Oral)    Resp (!) 22    SpO2 98%   Visual Acuity Right Eye Distance:   Left Eye Distance:   Bilateral Distance:    Right Eye Near:   Left Eye Near:    Bilateral Near:     Physical Exam Vitals reviewed.  Constitutional:      General: He is not in acute distress.    Appearance: He is not toxic-appearing.  HENT:     Right Ear: Tympanic membrane and ear canal normal.     Left Ear: Tympanic membrane and ear canal normal.     Nose: Nose normal.     Mouth/Throat:     Mouth: Mucous membranes are moist.     Pharynx: No oropharyngeal exudate or posterior oropharyngeal erythema.  Eyes:     Extraocular Movements: Extraocular movements intact.     Conjunctiva/sclera: Conjunctivae normal.     Pupils: Pupils are equal, round, and reactive to light.     Funduscopic exam:    Right eye: No papilledema.         Left eye: No papilledema.  Cardiovascular:     Rate and Rhythm: Normal rate and regular rhythm.     Heart sounds: No murmur heard. Pulmonary:     Effort: Pulmonary effort is normal.     Breath sounds: Normal breath sounds.  Musculoskeletal:     Cervical back: Neck supple.  Lymphadenopathy:     Cervical: No cervical adenopathy.  Skin:    Capillary Refill: Capillary refill takes less than 2 seconds.     Coloration: Skin is not jaundiced or pale.  Neurological:     General: No focal deficit present.     Mental Status: He is alert and oriented to person, place, and time.     Cranial Nerves: No cranial nerve deficit.     Sensory: No sensory deficit.     Motor: No weakness.     Coordination: Coordination normal.     Gait: Gait normal.     Deep Tendon Reflexes: Reflexes normal.  Psychiatric:        Behavior: Behavior normal.     UC Treatments / Results  Labs (all labs ordered are listed, but only abnormal results are displayed) Labs Reviewed  HIV ANTIBODY (ROUTINE TESTING W REFLEX)  RPR  CYTOLOGY, (ORAL, ANAL, URETHRAL) ANCILLARY ONLY    EKG   Radiology No results found.  Procedures Procedures (including critical care time)  Medications Ordered in UC Medications - No data to display  Initial Impression / Assessment and Plan / UC Course  I have reviewed the triage vital signs and the nursing notes.  Pertinent labs & imaging results that were available during my care of the patient were reviewed by me and considered in my medical decision making (see chart for details).     Given neck stretches, and muscle relaxer to try in the short term, since his posterior h/a may be related to neck pain/muscle spasm.  Assistance requested to find him a PCP for further eval Final Clinical Impressions(s) / UC Diagnoses   Final diagnoses:  Headache disorder     Discharge Instructions  Try doing the neck stretches.  Take tizanidine 4 mg 1 every 8 hours as needed for  muscle spasm.  This can make you sleepy, so you may just want to take it at bedtime.  If not helping after taking it a few days, then stop it      ED Prescriptions     Medication Sig Dispense Auth. Provider   tiZANidine (ZANAFLEX) 4 MG tablet Take 1 tablet (4 mg total) by mouth every 8 (eight) hours as needed for muscle spasms. 30 tablet Trecia Maring, Gwenlyn Perking, MD      PDMP not reviewed this encounter.   Barrett Henle, MD 07/29/21 1349    Barrett Henle, MD 07/29/21 1353

## 2021-07-29 NOTE — Discharge Instructions (Signed)
Try doing the neck stretches.  Take tizanidine 4 mg 1 every 8 hours as needed for muscle spasm.  This can make you sleepy, so you may just want to take it at bedtime.  If not helping after taking it a few days, then stop it

## 2021-07-30 LAB — RPR: RPR Ser Ql: NONREACTIVE

## 2021-08-02 LAB — CYTOLOGY, (ORAL, ANAL, URETHRAL) ANCILLARY ONLY
Chlamydia: NEGATIVE
Comment: NEGATIVE
Comment: NEGATIVE
Comment: NORMAL
Neisseria Gonorrhea: NEGATIVE
Trichomonas: NEGATIVE

## 2021-08-25 ENCOUNTER — Encounter: Payer: Self-pay | Admitting: Family

## 2021-08-25 ENCOUNTER — Other Ambulatory Visit: Payer: Self-pay

## 2021-08-25 ENCOUNTER — Ambulatory Visit: Payer: BC Managed Care – PPO | Admitting: Family

## 2021-08-25 ENCOUNTER — Other Ambulatory Visit (HOSPITAL_COMMUNITY)
Admission: RE | Admit: 2021-08-25 | Discharge: 2021-08-25 | Disposition: A | Discharge status: Home / Self Care | Payer: BC Managed Care – PPO | Source: Ambulatory Visit | Attending: Family | Admitting: Family

## 2021-08-25 VITALS — BP 152/96 | HR 85 | Temp 98.4°F | Ht 75.0 in | Wt 306.0 lb

## 2021-08-25 DIAGNOSIS — F418 Other specified anxiety disorders: Secondary | ICD-10-CM

## 2021-08-25 DIAGNOSIS — Z113 Encounter for screening for infections with a predominantly sexual mode of transmission: Secondary | ICD-10-CM | POA: Insufficient documentation

## 2021-08-25 DIAGNOSIS — I10 Essential (primary) hypertension: Secondary | ICD-10-CM | POA: Insufficient documentation

## 2021-08-25 MED ORDER — HYDROXYZINE HCL 10 MG PO TABS
10.0000 mg | ORAL_TABLET | Freq: Every evening | ORAL | 0 refills | Status: DC
Start: 2021-08-25 — End: 2023-06-14

## 2021-08-25 MED ORDER — AMLODIPINE BESYLATE 5 MG PO TABS
5.0000 mg | ORAL_TABLET | Freq: Every day | ORAL | 0 refills | Status: DC
Start: 2021-08-25 — End: 2021-09-27

## 2021-08-25 NOTE — Progress Notes (Signed)
New Patient Office Visit  Subjective:  Patient ID: Joe [name], male    DOB: 07-20-1995  Age: 26 y.o. MRN: [id number]  CC:  Chief Complaint  Patient presents with   Hypertension   Headache   Establish Care   STD testing    Joe [name] presents for establishing care. HPI: Hypertension: Patient is currently maintained on the following medications for blood pressure: None Patient reports good compliance with blood pressure medications. Patient denies chest pain, shortness of breath or swelling. Pt does report mild, dull headaches lasting through the day. Last 3 blood pressure readings in our office are as follows: BP Readings from Last 3 Encounters:  08/25/21 (!) 152/96  07/29/21 124/82  04/15/21 134/85  Anxiety/Depression: Patient complains of anxiety disorder.   He has the following symptoms: feelings of losing control, irritable, racing thoughts, sweating.  Onset of symptoms was approximately 2 years ago, He denies current suicidal and homicidal ideation.  Possible organic causes contributing are: none.  Risk factors: none  Previous treatment includes  none  and no therapy.   Depression screen PHQ 2/9 08/25/2021  Decreased Interest 0  Down, Depressed, Hopeless 0  PHQ - 2 Score 0    Past Medical History:  Diagnosis Date   Allergy    Congenital hammer toe    Saw Dr. Lunette Stands at age 40 yrs. Rec F/u after stops growing   OSA (obstructive sleep apnea)    Rx T and A   Perennial allergic rhinitis 12/27/2011   Nasal saline, cetirizine, Topical nasal steroid needed at times for control    Past Surgical History:  Procedure Laterality Date   ADENOIDECTOMY     TONSILLECTOMY      Family History  Problem Relation Age of Onset   Hypertension Mother    Asthma Mother    Diabetes Father    Hyperlipidemia Paternal Grandmother    Diabetes Paternal Grandmother    Hyperlipidemia Paternal Grandfather    Diabetes Paternal Grandfather    Hypertension Maternal  Grandmother    Kidney disease Maternal Grandmother        from hypertension   Hypertension Maternal Grandfather    Kidney disease Maternal Grandfather        from hypertension    Social History   Socioeconomic History   Marital status: Single    Spouse name: Not on file   Number of children: Not on file   Years of education: Not on file   Highest education level: Not on file  Occupational History   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  Tobacco Use   Smoking status: Never   Smokeless tobacco: Never  Building services engineer Use: Former   Substances: Nicotine, CBD, Flavoring  Substance and Sexual Activity   Alcohol use: No    Comment: occ   Drug use: No   Sexual activity: Yes    Birth control/protection: Condom  Other Topics Concern   Not on file  Social History Narrative   Not on file   Social Determinants of Health   Financial Resource Strain: Not on file  Food Insecurity: Not on file  Transportation Needs: Not on file  Physical Activity: Not on file  Stress: Not on file  Social Connections: Not on file  Intimate Partner Violence: Not on file    Objective:   Today's Vitals: BP (!) 152/96    Pulse 85    Temp 98.4 F (36.9 C) (Temporal)  Ht 6\' 3"  (1.905 m)    Wt (!) 306 lb (138.8 kg)    SpO2 97%    BMI 38.25 kg/m   Physical Exam Vitals and nursing note reviewed.  Constitutional:      General: He is not in acute distress.    Appearance: Normal appearance. He is obese.  HENT:     Head: Normocephalic.  Cardiovascular:     Rate and Rhythm: Normal rate and regular rhythm.  Pulmonary:     Effort: Pulmonary effort is normal.     Breath sounds: Normal breath sounds.  Musculoskeletal:        General: Normal range of motion.     Cervical back: Normal range of motion.  Skin:    General: Skin is warm and dry.  Neurological:     Mental Status: He is alert and oriented to person, place, and time.  Psychiatric:        Mood and Affect: Mood normal.     Assessment & Plan:   Problem List Items Addressed This Visit       Cardiovascular and Mediastinum   Essential hypertension - Primary    NEW - pt has had a significant amount of weight gain over the last year, advised pt on how this affects BP, importance of losing wt, low sodium diet and drinking at least 2L water qd. Starting Amlodipine, advised on use & SE. f/u in 1 mo.      Relevant Medications   amLODipine (NORVASC) 5 MG tablet     Other   Situational anxiety    NEW - pt is single dad to young toddler, states hard for him to unwind after work, anxiety may be contributing ho his HTN. Starting Hydroxyzine, pt advised on use & SE, f/u in 1 mo.      Relevant Medications   hydrOXYzine (ATARAX) 10 MG tablet   Other Visit Diagnoses     Screen for STD (sexually transmitted disease)       Relevant Orders   Urine cytology ancillary only (Completed)       Outpatient Encounter Medications as of 08/25/2021  Medication Sig   amLODipine (NORVASC) 5 MG tablet Take 1 tablet (5 mg total) by mouth daily. for blood pressure   hydrOXYzine (ATARAX) 10 MG tablet Take 1-2 tablets (10-20 mg total) by mouth every evening. to unwind, relax before sleep   tiZANidine (ZANAFLEX) 4 MG tablet Take 1 tablet (4 mg total) by mouth every 8 (eight) hours as needed for muscle spasms.   No facility-administered encounter medications on file as of 08/25/2021.    Follow-up: Return in about 4 weeks (around 09/22/2021) for fasting labs, follow up HTN.   11/22/2021, NP

## 2021-08-25 NOTE — Patient Instructions (Addendum)
Welcome to [facility]! It was a pleasure meeting you today.  Go to the lab to provide a urine sample for testing today.  As discussed, I have sent the blood pressure medicine, Amlodipine, to your pharmacy. You can start this today and take whenever is easiest to remember.  See the handout attached on other ways to lower your blood pressure including exercise! Drink at least 2 liters of water daily. Less juice and energy drinks!  I also sent Hydroxyzine for you to take in the evenings to help you unwind and relax. Start with 1 pill, can take 2 or 3 but may make you drowsy.   Please schedule a 1 month follow up visit today for fasting labs (if able, but not absolutely necessary).    PLEASE NOTE:  If you had any LAB tests please let us know if you have not heard back within a few days. You may see your results on MyChart before we have a chance to review them but we will give you a call once they are reviewed by [place]. If we ordered any REFERRALS today, please let us know if you have not heard from their office within the next week.  Let us know through MyChart if you are needing REFILLS, or have your pharmacy send [place] the request. You can also use MyChart to communicate with me or any office staff.  Please try these tips to maintain a healthy lifestyle:  Eat most of your calories during the day when you are active. Eliminate processed foods including packaged sweets (pies, cakes, cookies), reduce intake of potatoes, white bread, white pasta, and white rice. Look for whole grain options, oat flour or almond flour.  Each meal should contain half fruits/vegetables, one quarter protein, and one quarter carbs (no bigger than a computer mouse).  Cut down on sweet beverages. This includes juice, soda, and sweet tea. Also watch fruit intake, though this is a healthier sweet option, it still contains natural sugar! Limit to 3 servings daily.  Drink at least 1 glass of water  with each meal and aim for at least 8 glasses per day  Exercise at least 150 minutes every week.

## 2021-08-26 LAB — URINE CYTOLOGY ANCILLARY ONLY
Bacterial Vaginitis-Urine: NEGATIVE
Chlamydia: NEGATIVE
Comment: NEGATIVE
Comment: NEGATIVE
Comment: NORMAL
Neisseria Gonorrhea: NEGATIVE
Trichomonas: NEGATIVE

## 2021-08-27 ENCOUNTER — Encounter: Payer: Self-pay | Admitting: Family

## 2021-09-01 DIAGNOSIS — F418 Other specified anxiety disorders: Secondary | ICD-10-CM | POA: Insufficient documentation

## 2021-09-01 NOTE — Assessment & Plan Note (Addendum)
NEW - pt has had a significant amount of weight gain over the last year, advised pt on how this affects BP, importance of losing wt, low sodium diet and drinking at least 2L water qd. Starting Amlodipine, advised on use & SE. f/u in 1 mo.

## 2021-09-01 NOTE — Assessment & Plan Note (Signed)
NEW - pt is single dad to young toddler, states hard for him to unwind after work, anxiety may be contributing ho his HTN. Starting Hydroxyzine, pt advised on use & SE, f/u in 1 mo.

## 2021-09-27 ENCOUNTER — Encounter: Payer: Self-pay | Admitting: Family

## 2021-09-27 ENCOUNTER — Ambulatory Visit: Payer: BC Managed Care – PPO | Admitting: Family

## 2021-09-27 VITALS — BP 143/84 | HR 71 | Temp 98.7°F | Ht 75.0 in | Wt 305.4 lb

## 2021-09-27 DIAGNOSIS — Z6838 Body mass index (BMI) 38.0-38.9, adult: Secondary | ICD-10-CM

## 2021-09-27 DIAGNOSIS — I10 Essential (primary) hypertension: Secondary | ICD-10-CM

## 2021-09-27 LAB — COMPREHENSIVE METABOLIC PANEL
ALT: 34 U/L (ref 0–53)
AST: 23 U/L (ref 0–37)
Albumin: 4.3 g/dL (ref 3.5–5.2)
Alkaline Phosphatase: 61 U/L (ref 39–117)
BUN: 10 mg/dL (ref 6–23)
CO2: 29 mEq/L (ref 19–32)
Calcium: 9.5 mg/dL (ref 8.4–10.5)
Chloride: 103 mEq/L (ref 96–112)
Creatinine, Ser: 0.94 mg/dL (ref 0.40–1.50)
GFR: 112.25 mL/min (ref >60.00)
Glucose, Bld: 89 mg/dL (ref 70–99)
Potassium: 4.1 mEq/L (ref 3.5–5.1)
Sodium: 137 mEq/L (ref 135–145)
Total Bilirubin: 0.5 mg/dL (ref 0.2–1.2)
Total Protein: 7.2 g/dL (ref 6.0–8.3)

## 2021-09-27 LAB — LIPID PANEL
Cholesterol: 157 mg/dL (ref 0–200)
HDL: 46.3 mg/dL (ref >39.00)
LDL Cholesterol: 85 mg/dL (ref 0–99)
NonHDL: 110.68
Total CHOL/HDL Ratio: 3
Triglycerides: 128 mg/dL (ref 0.0–149.0)
VLDL: 25.6 mg/dL (ref 0.0–40.0)

## 2021-09-27 MED ORDER — AMLODIPINE BESYLATE 5 MG PO TABS: 7.5000 mg | ORAL_TABLET | Freq: Every day | ORAL | 0 refills | Status: DC

## 2021-09-27 NOTE — Patient Instructions (Signed)
It was very nice to see you today! ? ?Increase your Amlodipine to 1 & 1/2 pills daily = 7.5mg  daily. I will send more pills to your pharmacy - use a pill cutter for accurate cutting. ? ?Continue to drink at least 2 liters of water daily - bring a thermos to work and refill! And continue eating low sodium diet. ? ?Shoot for 20-30 minutes exercise as many days as possible, and keep working on losing weight :-) ? ? ?PLEASE NOTE: ? ?If you had any lab tests please let us know if you have not heard back within a few days. You may see your results on MyChart before we have a chance to review them but we will give you a call once they are reviewed by [place]. If we ordered any referrals today, please let us know if you have not heard from their office within the next week.  ? ?Please try these tips to maintain a healthy lifestyle: ? ?Eat most of your calories during the day when you are active. Eliminate processed foods including packaged sweets (pies, cakes, cookies), reduce intake of potatoes, white bread, white pasta, and white rice. Look for whole grain options, oat flour or almond flour. ? ?Each meal should contain half fruits/vegetables, one quarter protein, and one quarter carbs (no bigger than a computer mouse). ? ?Cut down on sweet beverages. This includes juice, soda, and sweet tea. Also watch fruit intake, though this is a healthier sweet option, it still contains natural sugar! Limit to 3 servings daily. ? ?Drink at least 1 glass of water with each meal and aim for at least 8 glasses per day ? ?Exercise at least 150 minutes every week.  ? ?

## 2021-09-27 NOTE — Assessment & Plan Note (Signed)
Chronic- Wt. Loss strategies reviewed including portion control, less carbs including sweets, eating most of calories earlier in day, drinking 64oz water qd, and establishing daily exercise routine. 

## 2021-09-27 NOTE — Progress Notes (Signed)
? ?Subjective:  ? ? ? Patient ID: Joe [name], male    DOB: 1996-05-05, 26 y.o.   MRN: [id number] ? ?Chief Complaint  ?Patient presents with  ? Hypertension  ?  Follow up w/ Labs.   ? ?HPI: ?Hypertension: Patient is currently maintained on the following medications for blood pressure: Amlodipine ?Failed meds include: none ?Patient reports good compliance with blood pressure medications. ?Patient denies chest pain, headaches, shortness of breath or swelling. ?Last 3 blood pressure readings in our office are as follows: ?BP Readings from Last 3 Encounters:  ?09/27/21 (!) 143/84  ?08/25/21 (!) 152/96  ?07/29/21 124/82  ? ? ?Health Maintenance Due  ?Topic Date Due  ? Hepatitis C Screening  Never done  ? COVID-19 Vaccine (2 - Moderna risk series) 12/27/2019  ? ? ?Past Medical History:  ?Diagnosis Date  ? Allergy   ? BMI 28.0-28.9,adult 01/01/2014  ? Closed fracture of fifth metacarpal bone of right hand with malunion 09/12/2016  ? Congenital hammer toe   ? Saw Dr. Almedia Balls at age 63 yrs. Rec F/u after stops growing  ? OSA (obstructive sleep apnea)   ? Rx T and A  ? Perennial allergic rhinitis 12/27/2011  ? Nasal saline, cetirizine, Topical nasal steroid needed at times for control  ? ? ?Past Surgical History:  ?Procedure Laterality Date  ? ADENOIDECTOMY    ? TONSILLECTOMY    ? ? ?Outpatient Medications Prior to Visit  ?Medication Sig Dispense Refill  ? hydrOXYzine (ATARAX) 10 MG tablet Take 1-2 tablets (10-20 mg total) by mouth every evening. to unwind, relax before sleep 30 tablet 0  ? amLODipine (NORVASC) 5 MG tablet Take 1 tablet (5 mg total) by mouth daily. for blood pressure 90 tablet 0  ? tiZANidine (ZANAFLEX) 4 MG tablet Take 1 tablet (4 mg total) by mouth every 8 (eight) hours as needed for muscle spasms. (Patient not taking: Reported on 09/27/2021) 30 tablet 0  ? ?No facility-administered medications prior to visit.  ? ? ?Allergies  ?Allergen Reactions  ? Shellfish Allergy   ?  postive skin test -- only  positive for food  ? ? ?   ?Objective:  ?  ?Physical Exam ?Vitals and nursing note reviewed.  ?Constitutional:   ?   General: He is not in acute distress. ?   Appearance: Normal appearance. He is obese.  ?HENT:  ?   Head: Normocephalic.  ?Cardiovascular:  ?   Rate and Rhythm: Normal rate and regular rhythm.  ?Pulmonary:  ?   Effort: Pulmonary effort is normal.  ?   Breath sounds: Normal breath sounds.  ?Musculoskeletal:     ?   General: Normal range of motion.  ?   Cervical back: Normal range of motion.  ?Skin: ?   General: Skin is warm and dry.  ?Neurological:  ?   Mental Status: He is alert and oriented to person, place, and time.  ?Psychiatric:     ?   Mood and Affect: Mood normal.  ? ? ?BP (!) 143/84 (BP Location: Left Arm, Patient Position: Sitting, Cuff Size: Large)   Pulse 71   Temp 98.7 ?F (37.1 ?C) (Temporal)   Ht 6' 3"  (1.905 m)   Wt (!) 305 lb 6.4 oz (138.5 kg)   SpO2 96%   BMI 38.17 kg/m?  ?Wt Readings from Last 3 Encounters:  ?09/27/21 (!) 305 lb 6.4 oz (138.5 kg)  ?08/25/21 (!) 306 lb (138.8 kg)  ?07/17/20 275 lb (124.7 kg)  ? ? ?   ?  Assessment & Plan:  ? ?Problem List Items Addressed This Visit   ? ?  ? Cardiovascular and Mediastinum  ? Essential hypertension - Primary  ?  BP better but not at goal, advised pt to increase dose to 1.5 pills daily = 7.70m. Sending refill today. ?  ?  ? Relevant Medications  ? amLODipine (NORVASC) 5 MG tablet  ? Other Relevant Orders  ? Lipid panel  ? Comp Met (CMET)  ?  ? Other  ? Class 2 severe obesity due to excess calories with serious comorbidity and body mass index (BMI) of 38.0 to 38.9 in adult ([facility]  ?  Chronic - Wt. Loss strategies reviewed including portion control, less carbs including sweets, eating most of calories earlier in day, drinking 64oz water qd, and establishing daily exercise routine. ?  ?  ? ? ?HJeanie Sewer NP ? ?

## 2021-09-27 NOTE — Assessment & Plan Note (Signed)
BP better but not at goal, advised pt to increase dose to 1.5 pills daily = 7.5mg . Sending refill today. ?

## 2021-10-01 NOTE — Progress Notes (Signed)
Hi Joe [name], ? ?Your glucose, electrolytes, liver & kidney function along with cholesterol numbers all look great! ?Keep up the good work with your diet and remember to try and exercise as many days as you can fit in!

## 2021-11-26 ENCOUNTER — Encounter: Payer: Self-pay | Admitting: Family

## 2021-11-29 ENCOUNTER — Ambulatory Visit: Payer: BC Managed Care – PPO | Admitting: Family

## 2021-11-29 ENCOUNTER — Ambulatory Visit (HOSPITAL_COMMUNITY)
Admission: RE | Admit: 2021-11-29 | Discharge: 2021-11-29 | Disposition: A | Discharge status: Home / Self Care | Payer: BC Managed Care – PPO | Source: Ambulatory Visit | Attending: Sports Medicine | Admitting: Sports Medicine

## 2021-11-29 ENCOUNTER — Encounter (HOSPITAL_COMMUNITY): Payer: Self-pay

## 2021-11-29 ENCOUNTER — Ambulatory Visit (HOSPITAL_COMMUNITY)
Admission: EM | Admit: 2021-11-29 | Discharge: 2021-11-29 | Disposition: A | Discharge status: Home / Self Care | Payer: BC Managed Care – PPO | Attending: Sports Medicine | Admitting: Sports Medicine

## 2021-11-29 DIAGNOSIS — R350 Frequency of micturition: Secondary | ICD-10-CM

## 2021-11-29 DIAGNOSIS — N50812 Left testicular pain: Secondary | ICD-10-CM | POA: Insufficient documentation

## 2021-11-29 LAB — POCT URINALYSIS DIPSTICK, ED / UC
Bilirubin Urine: NEGATIVE
Glucose, UA: NEGATIVE mg/dL
Hgb urine dipstick: NEGATIVE
Leukocytes,Ua: NEGATIVE
Nitrite: NEGATIVE
Protein, ur: NEGATIVE mg/dL
Specific Gravity, Urine: 1.025 (ref 1.005–1.030)
Urobilinogen, UA: 0.2 mg/dL (ref 0.0–1.0)
pH: 6.5 (ref 5.0–8.0)

## 2021-11-29 NOTE — Discharge Instructions (Signed)
Go get your scrotal ultrasound performed, will call with results ?We are sending off your urine for culture.  ?Be sure to continue your follow-up with your PCP, she can help interpret these results and treat as needed ?

## 2021-11-29 NOTE — ED Provider Notes (Signed)
?[facility] ? ? ? ?CSN: [id number] ?Arrival date & time: 11/29/21  6759 ? ? ?  ? ?History   ?Chief Complaint ?Chief Complaint  ?Patient presents with  ? Testicle Pain  ? ? ?HPI ?Joe [name] is a 26 y.o. male here for left testicular pain ? ? ?Testicle Pain ? ? ?Started Thursday ?Noticed left testicle was sitting slightly higher. Pain was a 6/10. ?Does have a younger daughter sometimes she jumps and accidentally hit in that area ?No redness, no swelling of the testicle ?No painful urination ?Mild increase in urinary frequency ?No history of same; no testicular surgery ?No fever/chills ?Patient does have an appointment with his PCP upcoming this Wednesday.  ? ?Currently sexually active, one partner. No concern for STI's. No penile discharge.  ? ?Past Medical History:  ?Diagnosis Date  ? Allergy   ? BMI 28.0-28.9,adult 01/01/2014  ? Closed fracture of fifth metacarpal bone of right hand with malunion 09/12/2016  ? Congenital hammer toe   ? Saw Dr. Lunette Stands at age 9 yrs. Rec F/u after stops growing  ? OSA (obstructive sleep apnea)   ? Rx T and A  ? Perennial allergic rhinitis 12/27/2011  ? Nasal saline, cetirizine, Topical nasal steroid needed at times for control  ? ? ?Patient Active Problem List  ? Diagnosis Date Noted  ? Class 2 severe obesity due to excess calories with serious comorbidity and body mass index (BMI) of 38.0 to 38.9 in adult [facility]) 09/27/2021  ? Situational anxiety 09/01/2021  ? Essential hypertension 08/25/2021  ? 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 Right hand pain 09/12/2016  ? Keratosis pilaris 12/28/2011  ? Perennial allergic rhinitis 12/27/2011  ? Congenital hammer toe   ? Allergy to shellfish 08/17/2011  ? ? ?Past Surgical History:  ?Procedure Laterality Date  ? ADENOIDECTOMY    ? TONSILLECTOMY    ? ? ? ? ? ?Home Medications   ? ?Prior to Admission medications   ?Medication Sig Start Date End Date Taking? Authorizing Provider  ?amLODipine (NORVASC) 5 MG tablet  Take 1.5 tablets (7.5 mg total) by mouth daily. for blood pressure 09/27/21   Dulce Sellar, NP  ?hydrOXYzine (ATARAX) 10 MG tablet Take 1-2 tablets (10-20 mg total) by mouth every evening. to unwind, relax before sleep 08/25/21   Dulce Sellar, NP  ? ? ?Family History ?Family History  ?Problem Relation Age of Onset  ? Hypertension Mother   ? Asthma Mother   ? Diabetes Father   ? Hyperlipidemia Paternal Grandmother   ? Diabetes Paternal Grandmother   ? Hyperlipidemia Paternal Grandfather   ? Diabetes Paternal Grandfather   ? Hypertension Maternal Grandmother   ? Kidney disease Maternal Grandmother   ?     from hypertension  ? Hypertension Maternal Grandfather   ? Kidney disease Maternal Grandfather   ?     from hypertension  ? ? ?Social History ?Social History  ? ?Tobacco Use  ? Smoking status: Never  ? Smokeless tobacco: Never  ?Vaping Use  ? Vaping Use: Former  ? Substances: Nicotine, CBD, Flavoring  ?Substance Use Topics  ? Alcohol use: No  ?  Comment: occ  ? Drug use: No  ? ? ? ?Allergies   ?Shellfish allergy ? ? ?Review of Systems ?Review of Systems  ?Constitutional:  Negative for activity change, chills and fever.  ?Gastrointestinal:  Negative for abdominal distention.  ?Genitourinary:  Positive for frequency and testicular pain. Negative for dysuria, hematuria, penile discharge,  penile pain, penile swelling and scrotal swelling.  ?Musculoskeletal:  Negative for back pain.  ?Skin:  Negative for color change, rash and wound.  ? ? ?Physical Exam ?Triage Vital Signs ?ED Triage Vitals  ?Enc Vitals Group  ?   BP 11/29/21 0838 (!) 143/92  ?   Pulse Rate 11/29/21 0838 85  ?   Resp 11/29/21 0838 18  ?   Temp 11/29/21 0838 98.9 ?F (37.2 ?C)  ?   Temp Source 11/29/21 0838 Oral  ?   SpO2 11/29/21 0838 96 %  ?   Weight --   ?   Height --   ?   Head Circumference --   ?   Peak Flow --   ?   Pain Score 11/29/21 0839 4  ?   Pain Loc --   ?   Pain Edu? --   ?   Excl. in GC? --   ? ?No data found. ? ?Updated Vital  Signs ?BP (!) 143/92 (BP Location: Left Arm)   Pulse 85   Temp 98.9 ?F (37.2 ?C) (Oral)   Resp 18   SpO2 96%  ? ?Physical Exam ?Constitutional:   ?   General: He is not in acute distress. ?   Appearance: Normal appearance. He is not ill-appearing or toxic-appearing.  ?HENT:  ?   Head: Normocephalic and atraumatic.  ?Eyes:  ?   Extraocular Movements: Extraocular movements intact.  ?   Conjunctiva/sclera: Conjunctivae normal.  ?   Pupils: Pupils are equal, round, and reactive to light.  ?Cardiovascular:  ?   Rate and Rhythm: Normal rate.  ?   Pulses: Normal pulses.  ?Pulmonary:  ?   Effort: Pulmonary effort is normal. No respiratory distress.  ?Abdominal:  ?   Palpations: Abdomen is soft.  ?   Tenderness: There is no right CVA tenderness or left CVA tenderness.  ?Genitourinary: ?   Penis: Normal.   ?   Comments: Chaperone offered to the patient, he politely declined. ? ?Evaluation of the scrotum does visualize that the left testicle does ride slightly higher than the right, however it does not appear to be obliquely oriented.  There is tenderness to palpation more so on the posterior aspect near the epididymis.  No testicular or penile lesions.  There is no redness or swelling about the scrotum. ? ?Testing for inguinal hernia bilaterally negative with cough reflex. ?Skin: ?   General: Skin is warm.  ?   Capillary Refill: Capillary refill takes less than 2 seconds.  ?   Findings: No rash.  ?Neurological:  ?   Mental Status: He is alert.  ?Psychiatric:     ?   Mood and Affect: Mood normal.     ?   Thought Content: Thought content normal.  ? ? ? ?UC Treatments / Results  ?Labs ?(all labs ordered are listed, but only abnormal results are displayed) ?Labs Reviewed  ?POCT URINALYSIS DIPSTICK, ED / UC - Abnormal; Notable for the following components:  ?    Result Value  ? Ketones, ur TRACE (*)   ? All other components within normal limits  ?URINE CULTURE  ? ? ?EKG ? ? ?Radiology ?No results  found. ? ?Procedures ?Procedures (including critical care time) ? ?Medications Ordered in UC ?Medications - No data to display ? ?Initial Impression / Assessment and Plan / UC Course  ?I have reviewed the triage vital signs and the nursing notes. ? ?Pertinent labs & imaging results that were available during my  care of the patient were reviewed by me and considered in my medical decision making (see chart for details). ? ?  ? ?Patient presents with left testicular pain and reported left testicle elevation since Thursday.  He does not have severe pain.  His pain has slightly decreased but continues at about 4/10.  He does note some increased urine frequency as well.  No trauma.  Examination of the testicle demonstrates no oblique or horizontal lie.  Cremasteric reflex is intact.  He does have a positive Prehn sign with relief in pain when elevating the hemiscrotum.  Point-of-care urine dipstick was negative for evidence of infection or hematuria.  I did send the urine off for culture.  Given the chronicity, I have very low suspicion for testicular torsion.  More likely epididymitis or even prostatitis.  There could be an element of testicular appendage torsion.  Will send for scrotal duplex ultrasound.  He does have a follow-up with his PCP on Wednesday, so at that point he will have his ultrasound and the urine culture back at that time.  If he has any emergent results from the imaging we will call him to dictate management.  Otherwise follow-up with PCP. ?Final Clinical Impressions(s) / UC Diagnoses  ? ?Final diagnoses:  ?Left testicular pain  ?Urinary frequency  ? ? ? ?Discharge Instructions   ? ?  ?Go get your scrotal ultrasound performed, will call with results ?We are sending off your urine for culture.  ?Be sure to continue your follow-up with your PCP, she can help interpret these results and treat as needed ? ? ? ? ?ED Prescriptions   ?None ?  ? ?PDMP not reviewed this encounter. ?  Madelyn Brunner,  DO ?11/29/21 [id number] ? ?

## 2021-11-29 NOTE — ED Notes (Signed)
Pt instructed to go to [facility] radiology for [place] ?

## 2021-11-29 NOTE — ED Triage Notes (Signed)
Pt c/o lt testicle pain and lifted higher then rt since Thursday. States his daughter jumps on him in that area playing sometimes. States has a PCP appt Wednesday for same. Denies taking meds or having difficulty urinating.  ?

## 2021-11-30 ENCOUNTER — Encounter: Payer: Self-pay | Admitting: Family

## 2021-11-30 NOTE — Progress Notes (Signed)
? ?Subjective:  ? ? ? Patient ID: Joe [name], male    DOB: Jul 06, 1996, 26 y.o.   MRN: [id number] ? ?Chief Complaint  ?Patient presents with  ? Hypertension  ? Testicle Pain  ?  Pt states he went to the ED on 5/15, Pain since 5/11. Was told to take ibuprofen.   ? ? ?HPI: ?Hypertension: Patient is currently maintained on the following medications for blood pressure: Amlodipine ?Failed meds include: none ?Patient reports good compliance with blood pressure medications. ?Patient denies chest pain, headaches, shortness of breath or swelling. ?Last 3 blood pressure readings in our office are as follows: ?BP Readings from Last 3 Encounters:  ?12/01/21 (!) 140/95  ?11/29/21 (!) 143/92  ?09/27/21 (!) 143/84  ?Varicocele:  seen in ER yesterday, reports having pain since last Thursday, scrotal U/S indicates small varicocele in left testicle. He was instructed to monitor, take Ibuprofen for pain, f/u with me. ? ?Assessment & Plan:  ? ?Problem List Items Addressed This Visit   ? ?  ? Cardiovascular and Mediastinum  ? Essential hypertension - Primary  ?  Chronic - not much change in BP increasing Amlodipine to 7.5mg , advised pt to increase to 10mg  qd, sending refill today, f/u in 28mos.  ? ?  ?  ? Relevant Medications  ? amLODipine (NORVASC) 10 MG tablet  ? ?Other Visit Diagnoses   ? ? Left varicocele      ? seen in ER yesterday, found on scrotal U/S, advised on avoiding too much Ibuprofen d/t high BP, try xtra strength Tylenol, 1000mg  q4h, wear supportive underwear/jock strap, referring to urology to manage. ? ?  ?   ? Other Relevant Orders  ? Ambulatory referral to Urology  ? ?  ? ? ?Outpatient Medications Prior to Visit  ?Medication Sig Dispense Refill  ? hydrOXYzine (ATARAX) 10 MG tablet Take 1-2 tablets (10-20 mg total) by mouth every evening. to unwind, relax before sleep 30 tablet 0  ? ibuprofen (ADVIL) 200 MG tablet Take 200 mg by mouth every 6 (six) hours as needed.    ? amLODipine (NORVASC) 5 MG tablet Take 1.5  tablets (7.5 mg total) by mouth daily. for blood pressure 135 tablet 0  ? ?No facility-administered medications prior to visit.  ? ? ?Past Medical History:  ?Diagnosis Date  ? Allergy   ? BMI 28.0-28.9,adult 01/01/2014  ? Closed fracture of fifth metacarpal bone of right hand with malunion 09/12/2016  ? Congenital hammer toe   ? Saw Dr. 01/03/2014 at age 46 yrs. Rec F/u after stops growing  ? 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 OSA (obstructive sleep apnea)   ? Rx T and A  ? Perennial allergic rhinitis 12/27/2011  ? Nasal saline, cetirizine, Topical nasal steroid needed at times for control  ? Right hand pain 09/12/2016  ? ? ?Past Surgical History:  ?Procedure Laterality Date  ? ADENOIDECTOMY    ? TONSILLECTOMY    ? ? ?Allergies  ?Allergen Reactions  ? Shellfish Allergy   ?  postive skin test -- only positive for food  ? ? ?   ?Objective:  ?  ?Physical Exam ?Vitals and nursing note reviewed.  ?Constitutional:   ?   General: He is not in acute distress. ?   Appearance: Normal appearance. He is obese.  ?HENT:  ?   Head: Normocephalic.  ?Cardiovascular:  ?   Rate and Rhythm: Normal rate and regular rhythm.  ?Pulmonary:  ?   Effort: Pulmonary  effort is normal.  ?   Breath sounds: Normal breath sounds.  ?Musculoskeletal:     ?   General: Normal range of motion.  ?   Cervical back: Normal range of motion.  ?Skin: ?   General: Skin is warm and dry.  ?Neurological:  ?   Mental Status: He is alert and oriented to person, place, and time.  ?Psychiatric:     ?   Mood and Affect: Mood normal.  ? ? ?BP (!) 140/95 (BP Location: Left Arm, Patient Position: Sitting, Cuff Size: Large)   Pulse 76   Temp 98.7 ?F (37.1 ?C) (Temporal)   Ht 6\' 3"  (1.905 m)   Wt (!) 305 lb 3.2 oz (138.4 kg)   SpO2 98%   BMI 38.15 kg/m?  ?Wt Readings from Last 3 Encounters:  ?12/01/21 (!) 305 lb 3.2 oz (138.4 kg)  ?09/27/21 (!) 305 lb 6.4 oz (138.5 kg)  ?08/25/21 (!) 306 lb (138.8 kg)  ? ?  ? ?Meds ordered this encounter  ?Medications   ? amLODipine (NORVASC) 10 MG tablet  ?  Sig: Take 1 tablet (10 mg total) by mouth daily. for blood pressure  ?  Dispense:  90 tablet  ?  Refill:  1  ?  Order Specific Question:   Supervising Provider  ?  Answer:   ANDY, CAMILLE L [[id number]]  ? ? ?10/23/21, NP ? ?

## 2021-12-01 ENCOUNTER — Encounter (HOSPITAL_COMMUNITY): Payer: Self-pay

## 2021-12-01 ENCOUNTER — Emergency Department (HOSPITAL_COMMUNITY)
Admission: EM | Admit: 2021-12-01 | Discharge: 2021-12-02 | Disposition: A | Discharge status: Home / Self Care | Payer: BC Managed Care – PPO | Attending: Emergency Medicine | Admitting: Emergency Medicine

## 2021-12-01 ENCOUNTER — Emergency Department (HOSPITAL_COMMUNITY): Payer: BC Managed Care – PPO

## 2021-12-01 ENCOUNTER — Ambulatory Visit: Payer: BC Managed Care – PPO | Admitting: Family

## 2021-12-01 ENCOUNTER — Other Ambulatory Visit: Payer: Self-pay

## 2021-12-01 ENCOUNTER — Encounter: Payer: Self-pay | Admitting: Family

## 2021-12-01 VITALS — BP 140/95 | HR 76 | Temp 98.7°F | Ht 75.0 in | Wt 305.2 lb

## 2021-12-01 DIAGNOSIS — R0789 Other chest pain: Secondary | ICD-10-CM | POA: Insufficient documentation

## 2021-12-01 DIAGNOSIS — R202 Paresthesia of skin: Secondary | ICD-10-CM | POA: Diagnosis present

## 2021-12-01 DIAGNOSIS — I10 Essential (primary) hypertension: Secondary | ICD-10-CM | POA: Diagnosis not present

## 2021-12-01 DIAGNOSIS — I861 Scrotal varices: Secondary | ICD-10-CM

## 2021-12-01 DIAGNOSIS — Z79899 Other long term (current) drug therapy: Secondary | ICD-10-CM | POA: Insufficient documentation

## 2021-12-01 LAB — CBC
HCT: 42.7 % (ref 39.0–52.0)
Hemoglobin: 14.9 g/dL (ref 13.0–17.0)
MCH: 30.5 pg (ref 26.0–34.0)
MCHC: 34.9 g/dL (ref 30.0–36.0)
MCV: 87.3 fL (ref 80.0–100.0)
Platelets: 292 10*3/uL (ref 150–400)
RBC: 4.89 MIL/uL (ref 4.22–5.81)
RDW: 12.5 % (ref 11.5–15.5)
WBC: 5.9 10*3/uL (ref 4.0–10.5)
nRBC: 0 % (ref 0.0–0.2)

## 2021-12-01 MED ORDER — AMLODIPINE BESYLATE 10 MG PO TABS: 10.0000 mg | ORAL_TABLET | Freq: Every day | ORAL | 1 refills | Status: DC

## 2021-12-01 NOTE — Assessment & Plan Note (Signed)
Chronic - not much change in BP increasing Amlodipine to 7.5mg , advised pt to increase to 10mg  qd, sending refill today, f/u in 39mos.  ?

## 2021-12-01 NOTE — ED Triage Notes (Signed)
Pt reports with chest pain and left foot numbness. Pt states that his provider changed his bp medication to Amlodipine and he started having chest pains and feeling bad today.  ?

## 2021-12-01 NOTE — Patient Instructions (Signed)
It was very nice to see you today! ? ?I have sent a referral to [facility] to discuss your varicocele. They will call you directly. ? ?I sent a refill for your Amlodipine to your pharmacy.  Take an extra pill today and then 2 pills tomorrow to equal the increased dose of 10mg .  The new prescription will be for 10mg , 1 pill daily. ? ?Follow up in 2 months. ? ? ? ?PLEASE NOTE: ? ?If you had any lab tests please let us know if you have not heard back within a few days. You may see your results on MyChart before we have a chance to review them but we will give you a call once they are reviewed by [place]. If we ordered any referrals today, please let us know if you have not heard from their office within the next week.  ? ?

## 2021-12-02 LAB — BASIC METABOLIC PANEL
Anion gap: 4 — ABNORMAL LOW (ref 5–15)
BUN: 11 mg/dL (ref 6–20)
CO2: 26 mmol/L (ref 22–32)
Calcium: 8.9 mg/dL (ref 8.9–10.3)
Chloride: 108 mmol/L (ref 98–111)
Creatinine, Ser: 0.94 mg/dL (ref 0.61–1.24)
GFR, Estimated: >60 mL/min (ref >60)
Glucose, Bld: 104 mg/dL — ABNORMAL HIGH (ref 70–99)
Potassium: 4.2 mmol/L (ref 3.5–5.1)
Sodium: 138 mmol/L (ref 135–145)

## 2021-12-02 LAB — TROPONIN I (HIGH SENSITIVITY)
Troponin I (High Sensitivity): 3 ng/L (ref <18)
Troponin I (High Sensitivity): 3 ng/L (ref <18)

## 2021-12-02 NOTE — ED Provider Notes (Signed)
[facility]-EMERGENCY DEPT  Provider Note  CSN: [id number] Arrival date & time: 12/01/21 2204  History Chief Complaint  Patient presents with   Chest Pain    Joe [name] is a 26 y.o. male with history of HTN was at his doctors office yesterday morning where his amlodipine was changed from 7.5mg  to 10mg  due to continued elevated pressures. He reports he took the new dose during the day yesterday but when he went to work around 7pm he had some aching pain in L lateral chest. A few hours later he noticed some tingling in his L lower leg which prompted him to come to the ED. Currently pain free, no tingling in leg now. Never had weakness or loss of sensation.    Home Medications Prior to Admission medications   Medication Sig Start Date End Date Taking? Authorizing Provider  amLODipine (NORVASC) 10 MG tablet Take 1 tablet (10 mg total) by mouth daily. for blood pressure 12/01/21   12/03/21, NP  hydrOXYzine (ATARAX) 10 MG tablet Take 1-2 tablets (10-20 mg total) by mouth every evening. to unwind, relax before sleep 08/25/21   10/23/21, NP  ibuprofen (ADVIL) 200 MG tablet Take 200 mg by mouth every 6 (six) hours as needed.    [provider]     Allergies    Shellfish allergy   Review of Systems   Review of Systems Please see HPI for pertinent positives and negatives  Physical Exam BP (!) 143/80   Pulse 66   Temp 98 F (36.7 C) (Oral)   Resp 15   Ht 6\' 3"  (1.905 m)   Wt (!) 138.3 kg   SpO2 99%   BMI 38.12 kg/m   Physical Exam Vitals and nursing note reviewed.  Constitutional:      Appearance: Normal appearance.  HENT:     Head: Normocephalic and atraumatic.     Nose: Nose normal.     Mouth/Throat:     Mouth: Mucous membranes are moist.  Eyes:     Extraocular Movements: Extraocular movements intact.     Conjunctiva/sclera: Conjunctivae normal.  Cardiovascular:     Rate and Rhythm: Normal rate.  Pulmonary:      Effort: Pulmonary effort is normal.     Breath sounds: Normal breath sounds.  Abdominal:     General: Abdomen is flat.     Palpations: Abdomen is soft.     Tenderness: There is no abdominal tenderness.  Musculoskeletal:        General: No swelling. Normal range of motion.     Cervical back: Neck supple.  Skin:    General: Skin is warm and dry.  Neurological:     General: No focal deficit present.     Mental Status: He is alert and oriented to person, place, and time.     Cranial Nerves: No cranial nerve deficit.     Sensory: No sensory deficit.     Motor: No weakness.  Psychiatric:        Mood and Affect: Mood normal.    ED Results / Procedures / Treatments   EKG EKG Interpretation  Date/Time:  Wednesday Dec 01 2021 22:19:30 EDT Ventricular Rate:  78 PR Interval:  179 QRS Duration: 100 QT Interval:  371 QTC Calculation: 423 R Axis:   1 Text Interpretation: Sinus rhythm ST elev, probable normal early repol pattern No significant change since last tracing Confirmed by Monday [phone]) on 12/02/2021 2:39:32 AM  Procedures Procedures  Medications Ordered in the ED Medications - No data to display  Initial Impression and Plan  Patient here with atypical chest pain, low risk factor profile. Patient reassured leg tingling is not a stroke, he has not focal deficits. He had labs done in triage showing normal CBC, BMP, and Trop. I personally viewed the images from radiology studies and agree with radiologist interpretation: CXR is clear. Will check a second trop to complete his cardiac workup. Anticipate discharge if negative.    ED Course   Clinical Course as of 12/02/21 0338  Thu Dec 02, 2021  5361 Repeat Trop remains normal. Plan discharge with PCP follow up for recheck of BP. RTED for any other concerns.  [CS]    Clinical Course User Index [CS] Pollyann Savoy, MD     MDM Rules/Calculators/A&P Medical Decision Making Given presenting complaint, I  considered that admission might be necessary. After review of results from ED lab and/or imaging studies, admission to the hospital is not indicated at this time.    Problems Addressed: Atypical chest pain: acute illness or injury Paresthesia of left leg: acute illness or injury  Amount and/or Complexity of Data Reviewed Labs: ordered. Decision-making details documented in ED Course. Radiology: ordered and independent interpretation performed. Decision-making details documented in ED Course. ECG/medicine tests: ordered and independent interpretation performed. Decision-making details documented in ED Course.  Risk Decision regarding hospitalization.    Final Clinical Impression(s) / ED Diagnoses Final diagnoses:  Atypical chest pain  Paresthesia of left leg    Rx / DC Orders ED Discharge Orders     None        Pollyann Savoy, MD 12/02/21 [phone]

## 2021-12-02 NOTE — ED Notes (Signed)
Discharge instructions reviewed, questions answered. Pt states understanding and no further questions. Pt ambulatory with steady gait upon discharge. No s/s of distress noted.  

## 2021-12-08 ENCOUNTER — Ambulatory Visit: Payer: BC Managed Care – PPO | Admitting: Family

## 2022-01-31 ENCOUNTER — Encounter: Payer: Self-pay | Admitting: Family

## 2022-01-31 ENCOUNTER — Ambulatory Visit: Payer: BC Managed Care – PPO | Admitting: Family

## 2022-01-31 VITALS — BP 134/92 | HR 76 | Temp 98.2°F | Ht 75.0 in | Wt 303.4 lb

## 2022-01-31 DIAGNOSIS — I10 Essential (primary) hypertension: Secondary | ICD-10-CM | POA: Diagnosis not present

## 2022-01-31 DIAGNOSIS — Z6838 Body mass index (BMI) 38.0-38.9, adult: Secondary | ICD-10-CM

## 2022-01-31 DIAGNOSIS — R059 Cough, unspecified: Secondary | ICD-10-CM

## 2022-01-31 NOTE — Assessment & Plan Note (Signed)
   chronic  wt down 3lbs  drinking more water  eating smaller meal at night  continue to encourage & advise on wt loss strategies  smaller portions, more exercise

## 2022-01-31 NOTE — Progress Notes (Signed)
Patient ID: Joe [name], male    DOB: 25-Feb-1996, 26 y.o.   MRN: [id number]  Chief Complaint  Patient presents with   Hypertension    Pt states his BP at home has been good.    cough    Pt c/o coughing up mucus(yellow) for a day, Has tried aleve and cough medications which did help. Pt also c/o swollen lymphoids.     HPI: Hypertension: Patient is currently maintained on the following medications for blood pressure: Amlodipine. Patient reports good compliance with blood pressure medications. Patient denies chest pain, headaches, shortness of breath or swelling. Last 3 blood pressure readings in our office are as follows: BP Readings from Last 3 Encounters:  01/31/22 (!) 134/92  12/02/21 (!) 143/80  12/01/21 (!) 140/95  Cough:  started after he ate something at work, denies any GI sx, reports he had mild dysphagia, no soreness, mild sinus mucus, no fever, mild cough, swollen neck lymph nodes, overall feeling better today.    Assessment & Plan:   Problem List Items Addressed This Visit       Cardiovascular and Mediastinum   Essential hypertension - Primary    chronic amlodipine 10mg  qd, BP better overall wt down 3 lbs denies SE, no swelling, HA is better continue same dose f/u 3 mos        Other   Class 2 severe obesity due to excess calories with serious comorbidity and body mass index (BMI) of 38.0 to 38.9 in adult [facility])    chronic wt down 3lbs drinking more water eating smaller meal at night continue to encourage & advise on wt loss strategies smaller portions, more exercise      Other Visit Diagnoses     Cough in adult    - feeling better today, concerned d/t swollen lymph nodes, mildly swollen today, advised on Ibuprofen 600mg  tid or up to 2 Aleve for aches, pain, fever. Hydrate with water, 2L/day.      Subjective:    Outpatient Medications Prior to Visit  Medication Sig Dispense Refill   amLODipine (NORVASC) 10 MG tablet Take 1 tablet (10 mg  total) by mouth daily. for blood pressure 90 tablet 1   hydrOXYzine (ATARAX) 10 MG tablet Take 1-2 tablets (10-20 mg total) by mouth every evening. to unwind, relax before sleep 30 tablet 0   ibuprofen (ADVIL) 200 MG tablet Take 200 mg by mouth every 6 (six) hours as needed.     No facility-administered medications prior to visit.   Past Medical History:  Diagnosis Date   Allergy    BMI 28.0-28.9,adult 01/01/2014   Closed fracture of fifth metacarpal bone of right hand with malunion 09/12/2016   Congenital hammer toe    Saw Dr. 01/03/2014 at age 70 yrs. 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  Constitutional:      General: He is not in acute  distress.    Appearance: Normal appearance. He is obese.  HENT:     Head: Normocephalic.     Right Ear: Tympanic membrane and ear canal normal.     Left Ear: Tympanic membrane and ear canal normal.  Cardiovascular:     Rate and Rhythm: Normal rate and regular rhythm.  Pulmonary:     Effort: Pulmonary effort is normal.     Breath sounds: Normal breath sounds.  Musculoskeletal:        General: Normal range of motion.     Cervical back: Normal range of motion.  Lymphadenopathy:     Cervical: Cervical adenopathy present.  Skin:    General: Skin is warm and dry.  Neurological:     Mental Status: He is alert and oriented to person, place, and time.  Psychiatric:        Mood and Affect: Mood normal.    BP (!) 134/92 (BP Location: Left Arm, Patient  Position: Sitting, Cuff Size: Large)   Pulse 76   Temp 98.2 F (36.8 C) (Temporal)   Ht 6\' 3"  (1.905 m)   Wt (!) 303 lb 6 oz (137.6 kg)   SpO2 97%   BMI 37.92 kg/m  Wt Readings from Last 3 Encounters:  01/31/22 (!) 303 lb 6 oz (137.6 kg)  12/01/21 (!) 305 lb (138.3 kg)  12/01/21 (!) 305 lb 3.2 oz (138.4 kg)       12/03/21, NP

## 2022-01-31 NOTE — Assessment & Plan Note (Addendum)
   chronic  amlodipine 10mg  qd, BP better overall  wt down 3 lbs  denies SE, no swelling, HA is better  continue same dose  f/u 3 mos

## 2022-02-04 ENCOUNTER — Encounter: Payer: Self-pay | Admitting: Family

## 2022-02-04 ENCOUNTER — Other Ambulatory Visit: Payer: Self-pay

## 2022-02-04 DIAGNOSIS — I10 Essential (primary) hypertension: Secondary | ICD-10-CM

## 2022-02-04 MED ORDER — AMLODIPINE BESYLATE 10 MG PO TABS: 10.0000 mg | ORAL_TABLET | Freq: Every day | ORAL | 1 refills | Status: DC

## 2022-02-04 NOTE — Telephone Encounter (Signed)
Pt called for an update on his rx request. I informed pt it takes 48-72 hrs to process requests.

## 2022-04-03 ENCOUNTER — Encounter: Payer: Self-pay | Admitting: Family

## 2022-04-04 ENCOUNTER — Encounter (HOSPITAL_BASED_OUTPATIENT_CLINIC_OR_DEPARTMENT_OTHER): Payer: Self-pay

## 2022-04-04 ENCOUNTER — Ambulatory Visit: Payer: BC Managed Care – PPO | Admitting: Physician Assistant

## 2022-04-04 ENCOUNTER — Encounter: Payer: Self-pay | Admitting: Physician Assistant

## 2022-04-04 ENCOUNTER — Emergency Department (HOSPITAL_BASED_OUTPATIENT_CLINIC_OR_DEPARTMENT_OTHER)
Admission: EM | Admit: 2022-04-04 | Discharge: 2022-04-04 | Disposition: A | Discharge status: Home / Self Care | Payer: BC Managed Care – PPO | Attending: Emergency Medicine | Admitting: Emergency Medicine

## 2022-04-04 ENCOUNTER — Emergency Department (HOSPITAL_BASED_OUTPATIENT_CLINIC_OR_DEPARTMENT_OTHER): Payer: BC Managed Care – PPO | Admitting: Radiology

## 2022-04-04 ENCOUNTER — Other Ambulatory Visit: Payer: Self-pay

## 2022-04-04 VITALS — BP 126/79 | HR 71 | Temp 98.7°F | Ht 75.0 in | Wt 303.6 lb

## 2022-04-04 DIAGNOSIS — I10 Essential (primary) hypertension: Secondary | ICD-10-CM | POA: Diagnosis not present

## 2022-04-04 DIAGNOSIS — R079 Chest pain, unspecified: Secondary | ICD-10-CM | POA: Insufficient documentation

## 2022-04-04 DIAGNOSIS — R0789 Other chest pain: Secondary | ICD-10-CM

## 2022-04-04 DIAGNOSIS — R0602 Shortness of breath: Secondary | ICD-10-CM

## 2022-04-04 LAB — BASIC METABOLIC PANEL
Anion gap: 8 (ref 5–15)
BUN: 10 mg/dL (ref 6–20)
CO2: 26 mmol/L (ref 22–32)
Calcium: 10.2 mg/dL (ref 8.9–10.3)
Chloride: 105 mmol/L (ref 98–111)
Creatinine, Ser: 0.9 mg/dL (ref 0.61–1.24)
GFR, Estimated: >60 mL/min (ref >60)
Glucose, Bld: 110 mg/dL — ABNORMAL HIGH (ref 70–99)
Potassium: 3.7 mmol/L (ref 3.5–5.1)
Sodium: 139 mmol/L (ref 135–145)

## 2022-04-04 LAB — D-DIMER, QUANTITATIVE: D-Dimer, Quant: <0.27 ug/mL-FEU (ref 0.00–0.50)

## 2022-04-04 LAB — CBC
HCT: 40.8 % (ref 39.0–52.0)
Hemoglobin: 13.7 g/dL (ref 13.0–17.0)
MCH: 28.9 pg (ref 26.0–34.0)
MCHC: 33.6 g/dL (ref 30.0–36.0)
MCV: 86.1 fL (ref 80.0–100.0)
Platelets: 299 10*3/uL (ref 150–400)
RBC: 4.74 MIL/uL (ref 4.22–5.81)
RDW: 12.8 % (ref 11.5–15.5)
WBC: 4.9 10*3/uL (ref 4.0–10.5)
nRBC: 0 % (ref 0.0–0.2)

## 2022-04-04 LAB — TROPONIN I (HIGH SENSITIVITY)
Troponin I (High Sensitivity): 2 ng/L (ref <18)
Troponin I (High Sensitivity): 2 ng/L (ref <18)

## 2022-04-04 MED ORDER — IBUPROFEN 400 MG PO TABS
600.0000 mg | ORAL_TABLET | Freq: Once | ORAL | Status: AC
  Administered 2022-04-04: 600 mg via ORAL
  Filled 2022-04-04: qty 1

## 2022-04-04 NOTE — ED Triage Notes (Addendum)
Pt presents POV from home, ambulatory, ca/ox4  Pt went to PCP for mid sternum chest pain when taking in a deep breath since Friday. PCP sent him here to have blood work and CXR d/t abnormal EKG in their office

## 2022-04-04 NOTE — Progress Notes (Signed)
Subjective:    Patient ID: Joe [name], male    DOB: 05-Jul-1996, 26 y.o.   MRN: [id number]  Chief Complaint  Patient presents with   Nasal Congestion   negative covid    Chest Pain    Pt states he has had sx since Friday. Took covid test yesterday, came back neg.      Patient is in today for acute symptoms as follows:  Chief complaint: Chest pain with breathing, started to feel it in throat and nose; hard to breathe sometimes. 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 Lasted most of his working day. Felt like a stabbing pain. Pain continued through the weekend. He can feel it now, just not as bad, still difficulty breathing. Not worse with exertion. Constant, nothing seems to alleviate it. No recent travel.  Symptom onset: 3 days ago Pertinent positives: Some fatigue from work, but otherwise feels good, some nasal congestion, but none now. Some dizziness at first, but not now.  Pertinent negatives: No hx of asthma  Treatments tried: None Sick exposure: None Sleep is limited due to work schedule / picking daughter up   ED visit in May for chest pain - says this feels different Denies anxiety or new stress  COVID-19 test yesterday at home was negative  Past Medical History:  Diagnosis Date   Allergy    BMI 28.0-28.9,adult 01/01/2014   Closed fracture of fifth metacarpal bone of right hand with malunion 09/12/2016   Congenital hammer toe    Saw Dr. Almedia Balls at age 31 yrs. 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   OSA (obstructive sleep apnea)    Rx T and A   Perennial allergic rhinitis 12/27/2011   Nasal saline, cetirizine, Topical nasal steroid needed at times for control   Right hand pain 09/12/2016    Past Surgical History:  Procedure Laterality Date   ADENOIDECTOMY     TONSILLECTOMY      Family History  Problem Relation Age of Onset   Hypertension Mother    Asthma  Mother    Diabetes Father    Hyperlipidemia Paternal Grandmother    Diabetes Paternal Grandmother    Hyperlipidemia Paternal Grandfather    Diabetes Paternal Grandfather    Hypertension Maternal Grandmother    Kidney disease Maternal Grandmother        from hypertension   Hypertension Maternal Grandfather    Kidney disease Maternal Grandfather        from hypertension    Social History   Tobacco Use   Smoking status: Never   Smokeless tobacco: Never  Vaping Use   Vaping Use: Former   Substances: Nicotine, CBD, Flavoring  Substance Use Topics   Alcohol use: No    Comment: occ   Drug use: No     Allergies  Allergen Reactions   Shellfish Allergy     postive skin test -- only positive for food    Review of Systems  Cardiovascular:  Positive for chest pain.   NEGATIVE UNLESS OTHERWISE INDICATED IN HPI      Objective:     BP 126/79 (BP Location: Left Arm, Patient Position: Sitting, Cuff Size: Large)   Pulse 71   Temp 98.7 F (37.1 C) (Temporal)   Ht 6\' 3"  (1.905 m)   Wt (!) 303 lb 9.6 oz (137.7 kg)   SpO2 98%  BMI 37.95 kg/m   Wt Readings from Last 3 Encounters:  04/04/22 (!) 303 lb 9.6 oz (137.7 kg)  01/31/22 (!) 303 lb 6 oz (137.6 kg)  12/01/21 (!) 305 lb (138.3 kg)    BP Readings from Last 3 Encounters:  04/04/22 126/79  01/31/22 (!) 134/92  12/02/21 (!) 143/80     Physical Exam Vitals and nursing note reviewed.  Constitutional:      General: He is not in acute distress.    Appearance: Normal appearance. He is obese. He is not toxic-appearing.     Comments: Nasal congestion  HENT:     Head: Normocephalic and atraumatic.     Right Ear: Tympanic membrane, ear canal and external ear normal.     Left Ear: Tympanic membrane, ear canal and external ear normal.     Nose: Nose normal.     Mouth/Throat:     Mouth: Mucous membranes are moist.     Pharynx: Oropharynx is clear.  Eyes:     Extraocular Movements: Extraocular movements intact.      Conjunctiva/sclera: Conjunctivae normal.     Pupils: Pupils are equal, round, and reactive to light.  Cardiovascular:     Rate and Rhythm: Normal rate and regular rhythm.     Pulses: Normal pulses.     Heart sounds: Normal heart sounds.  Pulmonary:     Effort: Pulmonary effort is normal.     Breath sounds: Normal breath sounds.  Abdominal:     General: Abdomen is flat.  Musculoskeletal:     Cervical back: Normal range of motion and neck supple.     Right lower leg: No tenderness. No edema.     Left lower leg: No tenderness. No edema.  Skin:    General: Skin is warm and dry.  Neurological:     General: No focal deficit present.     Mental Status: He is alert and oriented to person, place, and time.  Psychiatric:        Mood and Affect: Mood normal.        Behavior: Behavior normal.        Assessment & Plan:  Other chest pain -     EKG 12-Lead  Shortness of breath  Essential hypertension -     EKG 12-Lead    26 yo African-American male with history of morbid obesity, HTN, significant family hx of asthma, diabetes, HTN, in for chest pain and SOB ongoing the last three days. I personally reviewed his EKG, which showed NSR, 60 bpm, with elevated ST segments in anterolateral leads - similar to previous EKG in May; however, with new symptoms including SOB, would feel most comfortable with ED evaluation at this time. Pt feels safe and stable to drive himself; he will proceed to [facility] at this time.   This note was prepared with assistance of Conservation officer, historic buildings. Occasional wrong-word or sound-a-like substitutions may have occurred due to the inherent limitations of voice recognition software.      Calib Wadhwa M Cleota Pellerito, PA-C

## 2022-04-04 NOTE — Discharge Instructions (Addendum)
Note the work-up today was overall reassuring.  There is no acute abnormalities when looking at your heart and your lungs from multiple perspectives.  As we discussed, given your significant improvement with ibuprofen while at home in the emergency department, I recommend continued ibuprofen therapy.  I recommend close follow with your PCP in 3 to 5 days for reevaluation.  Please do not hesitate to return to the emergency department for worrisome signs symptoms we discussed become apparent.

## 2022-04-04 NOTE — ED Provider Notes (Signed)
[facility] DEPT Provider Note   CSN: [id number] Arrival date & time: 04/04/22  1136     History  Chief Complaint  Patient presents with   Chest Pain    Joe [name] is a 26 y.o. male.   Chest Pain   26 year old male presents emergency department with complaints of chest pain.  Patient states chest pain began insidiously this past Friday.  He notes continued feelings of chest pain as well as associated feelings of shortness of breath.  He states that he feels short of breath because every time he takes a deep breath he has sharp pains at bilateral lower lung bases.  Denies any recent illness.  He was seen by his primary care provider for symptom emergency department for further evaluation of his symptoms.  Patient describes taking ibuprofen at home once or twice with complete resolution of symptoms.  Denies fever, chills, night sweats, nausea, vomiting, abdominal pain, urinary symptoms, change in bowel habits.  Denies any history of cardiac or pulmonary complications.  Denies history of DVT/PE, recent surgery/immobilization, cancer diagnosis, hormonal therapy.  Past medical history significant for hypertension, obesity  Home Medications Prior to Admission medications   Medication Sig Start Date End Date Taking? Authorizing Provider  amLODipine (NORVASC) 10 MG tablet Take 1 tablet (10 mg total) by mouth daily. for blood pressure 02/04/22   Jeanie Sewer, NP  hydrOXYzine (ATARAX) 10 MG tablet Take 1-2 tablets (10-20 mg total) by mouth every evening. to unwind, relax before sleep 08/25/21   Jeanie Sewer, NP  ibuprofen (ADVIL) 200 MG tablet Take 200 mg by mouth every 6 (six) hours as needed.    [provider]      Allergies    Shellfish allergy    Review of Systems   Review of Systems  Cardiovascular:  Positive for chest pain.  All other systems reviewed and are negative.   Physical Exam Updated Vital Signs BP 139/87   Pulse 65    Temp 98.2 F (36.8 C)   Resp 15   SpO2 94%  Physical Exam Vitals and nursing note reviewed.  Constitutional:      General: He is not in acute distress.    Appearance: He is well-developed.  HENT:     Head: Normocephalic and atraumatic.  Eyes:     Conjunctiva/sclera: Conjunctivae normal.  Cardiovascular:     Rate and Rhythm: Normal rate and regular rhythm.     Heart sounds: No murmur heard. Pulmonary:     Effort: Pulmonary effort is normal. No respiratory distress.     Breath sounds: Normal breath sounds. No decreased breath sounds, wheezing, rhonchi or rales.     Comments: Sharp pleuritic type chest pain is elicited with patient taking a deep breath.  It is located on the anterior lower border of rib cage. Abdominal:     Palpations: Abdomen is soft.     Tenderness: There is no abdominal tenderness. There is no right CVA tenderness or left CVA tenderness.  Musculoskeletal:        General: No swelling.     Cervical back: Normal range of motion and neck supple. No rigidity or tenderness.     Right lower leg: No edema.     Left lower leg: No edema.  Skin:    General: Skin is warm and dry.     Capillary Refill: Capillary refill takes less than 2 seconds.  Neurological:     Mental Status: He is alert.  Psychiatric:  Mood and Affect: Mood normal.     ED Results / Procedures / Treatments   Labs (all labs ordered are listed, but only abnormal results are displayed) Labs Reviewed  BASIC METABOLIC PANEL - Abnormal; Notable for the following components:      Result Value   Glucose, Bld 110 (*)    All other components within normal limits  CBC  D-DIMER, QUANTITATIVE  TROPONIN I (HIGH SENSITIVITY)  TROPONIN I (HIGH SENSITIVITY)    EKG None  Radiology DG Chest 2 View  Result Date: 04/04/2022 CLINICAL DATA:  Chest pain EXAM: CHEST - 2 VIEW COMPARISON:  12/01/2021 FINDINGS: Transverse diameter of heart is increased. There is poor inspiration. There are no signs of  pulmonary edema or focal pulmonary consolidation. There is no pleural effusion or pneumothorax. IMPRESSION: No active cardiopulmonary disease. Electronically Signed   By: Elmer Picker M.D.   On: 04/04/2022 12:58    Procedures Procedures    Medications Ordered in ED Medications  ibuprofen (ADVIL) tablet 600 mg (600 mg Oral Given 04/04/22 1507)    ED Course/ Medical Decision Making/ A&P                           Medical Decision Making Amount and/or Complexity of Data Reviewed Labs: ordered. Radiology: ordered.   This patient presents to the ED for concern of chest pain, this involves an extensive number of treatment options, and is a complaint that carries with it a high risk of complications and morbidity.  The differential diagnosis includes The emergent causes of chest pain include: Acute coronary syndrome, tamponade, pericarditis/myocarditis, aortic dissection, pulmonary embolism, tension pneumothorax, pneumonia. Chronic angina, aortic stenosis, cardiomyopathy, mitral valve prolapse, pulmonary hypertension, pleuritis, pneumothorax, costochondritis   Co morbidities that complicate the patient evaluation  See HPI   Additional history obtained:  Additional history obtained from EMR External records from outside source obtained and reviewed including family medicine note from earlier today indicating patient's EKG findings no which or not new in nature.   Lab Tests:  I Ordered, and personally interpreted labs.  The pertinent results include: No electrolyte abnormalities noted.  Renal function within normal limits.  No leukocytosis noted.  No evidence of anemia.  Platelets within normal range.  D-dimer not elevated.  Initial troponin of 2 with repeat 2; no new EKG findings indicative of ischemia.  Doubt ACS.   Imaging Studies ordered:  I ordered imaging studies including chest x-ray I independently visualized and interpreted imaging which showed no acute cardiopulmonary  abnormalities. I agree with the radiologist interpretation   Cardiac Monitoring: / EKG:  The patient was maintained on a cardiac monitor.  I personally viewed and interpreted the cardiac monitored which showed an underlying rhythm of: Sinus rhythm without new findings indicative of ischemia.  Patient has known ST/T wave abnormalities that have been present for the past several months from prior EKGs.   Consultations Obtained:  N/a   Problem List / ED Course / Critical interventions / Medication management  Chest pain I ordered medication including ibuprofen for pain.   Reevaluation of the patient after these medicines showed that the patient improved I have reviewed the patients home medicines and have made adjustments as needed   Social Determinants of Health:  Denies tobacco, illicit drug use.   Test / Admission - Considered:  Chest pain Vitals signs  within normal range and stable throughout visit. Laboratory/imaging studies significant for: See above Patient's symptoms seem pleuritic  in nature.  He has been responding well to ibuprofen at home with near complete resolution of symptoms.  Continue therapy recommended given overall negative work-up today.  Doubt ACS.  Doubt pulmonary embolism.  Doubt pneumothorax, pneumonia, CHF exacerbation.  Doubt pericarditis/myocarditis.  Close follow-up with PCP recommended 3 to 5 days for reevaluation of symptoms.  Treatment plan was discussed at length with patient he knowledge understanding was agreeable to said plan. Worrisome signs and symptoms were discussed with the patient, and the patient acknowledged understanding to return to the ED if noticed. Patient was stable upon discharge.         Final Clinical Impression(s) / ED Diagnoses Final diagnoses:  Chest pain, unspecified type    Rx / DC Orders ED Discharge Orders     None         Wilnette Kales, [place] 04/04/22 1627    Fransico Meadow, MD 04/07/22  1055

## 2022-04-04 NOTE — Patient Instructions (Signed)
Please proceed to [facility] at this time

## 2022-05-03 ENCOUNTER — Ambulatory Visit: Payer: BC Managed Care – PPO | Admitting: Family

## 2022-05-04 ENCOUNTER — Ambulatory Visit: Payer: BC Managed Care – PPO | Admitting: Family

## 2022-05-11 ENCOUNTER — Encounter: Payer: Self-pay | Admitting: Family

## 2022-05-11 ENCOUNTER — Ambulatory Visit (INDEPENDENT_AMBULATORY_CARE_PROVIDER_SITE_OTHER): Payer: BC Managed Care – PPO | Admitting: Family

## 2022-05-11 ENCOUNTER — Ambulatory Visit: Payer: BC Managed Care – PPO | Admitting: Family

## 2022-05-11 VITALS — BP 140/100 | HR 87 | Temp 97.5°F | Ht 75.0 in | Wt 307.4 lb

## 2022-05-11 DIAGNOSIS — I10 Essential (primary) hypertension: Secondary | ICD-10-CM

## 2022-05-11 MED ORDER — AMLODIPINE BESYLATE 10 MG PO TABS: 10.0000 mg | ORAL_TABLET | Freq: Every day | ORAL | 1 refills | Status: DC

## 2022-05-11 NOTE — Progress Notes (Signed)
Patient ID: Joe [name], male    DOB: [birth year], 26 y.o.   MRN: [id number]  Chief Complaint  Patient presents with   Hypertension    Follow up, Pt has not taken his BP medication in a week.   HPI: Hypertension: Patient is currently maintained on the following medications for blood pressure: Amlodipine  Failed meds include: none Patient reports good compliance with blood pressure medications. Patient denies chest pain, headaches, shortness of breath or swelling. Last 3 blood pressure readings in our office are as follows: BP Readings from Last 3 Encounters:  05/11/22 (!) 140/100  04/04/22 130/86  04/04/22 126/79    Assessment & Plan:   Problem List Items Addressed This Visit       Cardiovascular and Mediastinum   Essential hypertension - Primary    chronic amlodipine 10mg  qd, BP high today, says he ran out of pills a week ago & b/c of his ins. he can't get a refill right away?  sending refill - advised pt to use GoodRX coupon and get 90 pills for $34 vs. $50 for 30 pills  f/u 3 mos      Relevant Medications   amLODipine (NORVASC) 10 MG tablet   Subjective:    Outpatient Medications Prior to Visit  Medication Sig Dispense Refill   hydrOXYzine (ATARAX) 10 MG tablet Take 1-2 tablets (10-20 mg total) by mouth every evening. to unwind, relax before sleep 30 tablet 0   ibuprofen (ADVIL) 200 MG tablet Take 200 mg by mouth every 6 (six) hours as needed.     amLODipine (NORVASC) 10 MG tablet Take 1 tablet (10 mg total) by mouth daily. for blood pressure 90 tablet 1   No facility-administered medications prior to visit.   Past Medical History:  Diagnosis Date   Allergy    BMI 28.0-28.9,adult 01/01/2014   Closed fracture of fifth metacarpal bone of right hand with malunion 09/12/2016   Congenital hammer toe    Saw Dr. Almedia Balls at age 76 yrs. 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  Constitutional:      General: He is not in acute distress.    Appearance: Normal appearance.  HENT:     Head: Normocephalic.  Cardiovascular:     Rate and Rhythm: Normal rate and regular rhythm.  Pulmonary:     Effort: Pulmonary effort is normal.     Breath sounds: Normal breath sounds.  Musculoskeletal:        General: Normal range of motion.     Cervical back: Normal range of motion.  Skin:    General: Skin is warm and dry.  Neurological:     Mental Status: He is alert and oriented to person, place, and time.  Psychiatric:        Mood and Affect: Mood normal.    BP (!) 140/100 (BP Location: Left Arm, Patient Position: Sitting, Cuff  Size: Large)   Pulse 87   Temp (!) 97.5 F (36.4 C) (Temporal)   Ht 6\' 3"  (1.905 m)   Wt (!) 307 lb 6 oz (139.4 kg)   SpO2 96%   BMI 38.42 kg/m  Wt Readings from Last 3 Encounters:  05/11/22 (!) 307 lb 6 oz (139.4 kg)  04/04/22 (!) 303 lb 9.6 oz (137.7 kg)  01/31/22 (!) 303 lb 6 oz (137.6 kg)       02/02/22, NP

## 2022-05-11 NOTE — Assessment & Plan Note (Signed)
.   chronic . amlodipine 10mg  qd, BP high today, says he ran out of pills a week ago & b/c of his ins. he can't get a refill right away?  . sending refill - advised pt to use GoodRX coupon and get 90 pills for $34 vs. $50 for 30 pills  . f/u 3 mos

## 2022-06-01 ENCOUNTER — Encounter: Payer: Self-pay | Admitting: Physician Assistant

## 2022-06-01 ENCOUNTER — Ambulatory Visit: Payer: BC Managed Care – PPO | Admitting: Physician Assistant

## 2022-06-01 VITALS — BP 140/80 | HR 80 | Temp 99.8°F | Ht 75.0 in | Wt 315.5 lb

## 2022-06-01 DIAGNOSIS — R1010 Upper abdominal pain, unspecified: Secondary | ICD-10-CM | POA: Diagnosis not present

## 2022-06-01 DIAGNOSIS — N489 Disorder of penis, unspecified: Secondary | ICD-10-CM | POA: Diagnosis not present

## 2022-06-01 DIAGNOSIS — J029 Acute pharyngitis, unspecified: Secondary | ICD-10-CM

## 2022-06-01 DIAGNOSIS — I10 Essential (primary) hypertension: Secondary | ICD-10-CM

## 2022-06-01 LAB — POC URINALSYSI DIPSTICK (AUTOMATED)
Bilirubin, UA: NEGATIVE
Blood, UA: NEGATIVE
Glucose, UA: NEGATIVE
Ketones, UA: NEGATIVE
Leukocytes, UA: NEGATIVE
Nitrite, UA: NEGATIVE
Protein, UA: NEGATIVE
Spec Grav, UA: 1.02 (ref 1.010–1.025)
Urobilinogen, UA: 0.2 E.U./dL
pH, UA: 6 (ref 5.0–8.0)

## 2022-06-01 LAB — POCT RAPID STREP A (OFFICE): Rapid Strep A Screen: POSITIVE — AB

## 2022-06-01 MED ORDER — AMOXICILLIN 500 MG PO CAPS: 500.0000 mg | ORAL_CAPSULE | Freq: Two times a day (BID) | ORAL | 0 refills | Status: AC

## 2022-06-01 NOTE — Patient Instructions (Addendum)
It was great to see you!  Follow-up with Judeth Cornfield: -if your penile symptoms persist, we will refer you to urology if needed -if your abdominal cramping also persists or change -to review your blood pressure  Start amoxicillin for your strep throat  Trial the prilosec for your heartburn  Take care,  Jarold Motto PA-C

## 2022-06-01 NOTE — Progress Notes (Addendum)
Joe [name] is a 26 y.o. male here for a new problem.  History of Present Illness:   Chief Complaint  Patient presents with   Sore Throat    Pt c/o sore throat x 2 weeks, abdominal cramping. Neg Covid test on 11/13.    HPI  Abdominal pain Patient is complaining of constant 4/10 cramping in upper abdominal region. He reports that the pain stays the same when eating and doesn't affect his bowel movements. This has been occurring for 2 weeks. Denies: constipation, diarrhea, rectal bleeding, n/v. Pain is dull and constant. He is not overly worried about this, does get occasional heartburn.   Sore throat He confirms a sore throat, but reports that it doesn't hurt to swallow. Patient states that his appetite is unchanged. He does not take any OTC medication for his stomach.    HTN Currently taking amlodipine 10 mg. At home blood pressure readings are: not regularly checked. Patient denies chest pain, SOB, blurred vision, dizziness, unusual headaches, lower leg swelling. Patient is compliant with medication. Denies excessive caffeine intake, stimulant usage, excessive alcohol intake, or increase in salt consumption.  BP Readings from Last 3 Encounters:  06/01/22 (!) 140/80  05/11/22 (!) 140/100  04/04/22 130/86    Groin sensitivity Patient is complaining that his groin is sensitive to the touch -- more like a numbness sensation.  He denies pain, lesions, and STD concerns. Denies issues with obtaining/maintaining erection or urinary issues. Has been in the same relationship for 4 years. 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   Past Medical History:  Diagnosis Date   Allergy    BMI 28.0-28.9,adult 01/01/2014   Closed fracture of fifth metacarpal bone of right hand with malunion 09/12/2016   Congenital hammer toe    Saw Dr. Lunette Stands at age 77 yrs. 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   OSA (obstructive sleep apnea)    Rx T and A    Perennial allergic rhinitis 12/27/2011   Nasal saline, cetirizine, Topical nasal steroid needed at times for control   Right hand pain 09/12/2016     Social History   Tobacco Use   Smoking status: Never   Smokeless tobacco: Never  Vaping Use   Vaping Use: Former   Substances: Nicotine, CBD, Flavoring  Substance Use Topics   Alcohol use: No    Comment: occ   Drug use: No    Past Surgical History:  Procedure Laterality Date   ADENOIDECTOMY     TONSILLECTOMY      Family History  Problem Relation Age of Onset   Hypertension Mother    Asthma Mother    Diabetes Father    Hyperlipidemia Paternal Grandmother    Diabetes Paternal Grandmother    Hyperlipidemia Paternal Grandfather    Diabetes Paternal Grandfather    Hypertension Maternal Grandmother    Kidney disease Maternal Grandmother        from hypertension   Hypertension Maternal Grandfather    Kidney disease Maternal Grandfather        from hypertension    Allergies  Allergen Reactions   Shellfish Allergy     postive skin test -- only positive for food    Current Medications:   Current Outpatient Medications:    amLODipine (NORVASC) 10 MG tablet, Take 1 tablet (10 mg total) by mouth daily. for blood pressure, Disp: 90 tablet, Rfl: 1   amoxicillin (AMOXIL) 500 MG capsule, Take 1  capsule (500 mg total) by mouth 2 (two) times daily for 10 days., Disp: 20 capsule, Rfl: 0   hydrOXYzine (ATARAX) 10 MG tablet, Take 1-2 tablets (10-20 mg total) by mouth every evening. to unwind, relax before sleep, Disp: 30 tablet, Rfl: 0   ibuprofen (ADVIL) 200 MG tablet, Take 200 mg by mouth every 6 (six) hours as needed., Disp: , Rfl:    Review of Systems:   Review of Systems  HENT:  Positive for sore throat.        (+) heartburn  Respiratory:  Positive for shortness of breath. Negative for cough.   Gastrointestinal:  Positive for abdominal pain (upper region). Negative for blood in stool, constipation and diarrhea.   Musculoskeletal:        (+) groin sensitivity     Vitals:   Vitals:   06/01/22 1029 06/01/22 1054  BP: (!) 150/72 (!) 140/80  Pulse: 80   Temp: 99.8 F (37.7 C)   TempSrc: Temporal   SpO2: 98%   Weight: (!) 315 lb 8 oz (143.1 kg)   Height: 6\' 3"  (1.905 m)      Body mass index is 39.43 kg/m.  Physical Exam:   Physical Exam Exam conducted with a chaperone present.  Constitutional:      General: He is not in acute distress.    Appearance: Normal appearance. He is not ill-appearing.  HENT:     Head: Normocephalic and atraumatic.     Right Ear: External ear normal.     Left Ear: External ear normal.  Eyes:     Extraocular Movements: Extraocular movements intact.     Pupils: Pupils are equal, round, and reactive to light.  Cardiovascular:     Rate and Rhythm: Normal rate and regular rhythm.     Heart sounds: Normal heart sounds. No murmur heard.    No gallop.  Pulmonary:     Effort: Pulmonary effort is normal. No respiratory distress.     Breath sounds: Normal breath sounds. No wheezing or rales.  Abdominal:     General: Abdomen is flat. Bowel sounds are normal.     Palpations: Abdomen is soft.     Tenderness: There is no abdominal tenderness. There is no right CVA tenderness, left CVA tenderness, guarding or rebound.  Genitourinary:    Comments: No visual or palpable abnormalities to penis Skin:    General: Skin is warm and dry.  Neurological:     Mental Status: He is alert and oriented to person, place, and time.  Psychiatric:        Judgment: Judgment normal.    Results for orders placed or performed in visit on 06/01/22  POCT rapid strep A  Result Value Ref Range   Rapid Strep A Screen Positive (A) Negative  POCT Urinalysis Dipstick (Automated)  Result Value Ref Range   Color, UA yellow    Clarity, UA clear    Glucose, UA Negative Negative   Bilirubin, UA Negative    Ketones, UA Negative    Spec Grav, UA 1.020 1.010 - 1.025   Blood, UA Negative     pH, UA 6.0 5.0 - 8.0   Protein, UA Negative Negative   Urobilinogen, UA 0.2 0.2 or 1.0 E.U./dL   Nitrite, UA Negative    Leukocytes, UA Negative Negative    Assessment and Plan:   Sore throat Strep is positive No red flags on exam Start amoxicillin 875 mg po BID x 10 days Follow-up if new/worsening sx  Abnormality of penis No visual abnormalities, unclear etiology Reviewed case briefly with SP, Jacquiline Doe MD Recommend watchful waiting for a week or so If symptoms persist, recommend reach out to PCP and possibly refer to urology We did obtain a urine per his request and this was normal.  Essential hypertension Above goal No evidence of end organ damage Continue amlodipine 10 mg daily Monitor BP at home and follow-up with PCP  Pain of upper abdomen No red flags No evidence of acute abdomen warranting ER evaluation or urgent imaging Does have a component of GERD, will start omeprazole daily for 1 to 2 weeks If symptoms worsen or do not improve, close follow-up with PCP  I,Verona Buck,acting as a scribe for [employer], PA.,have documented all relevant documentation on the behalf of Jarold Motto, PA,as directed by  Jarold Motto, PA while in the presence of Jarold Motto, [place].  I, Jarold Motto, [place], have reviewed all documentation for this visit. The documentation on 06/01/22 for the exam, diagnosis, procedures, and orders are all accurate and complete.  Jarold Motto, PA-C

## 2022-08-07 NOTE — Progress Notes (Signed)
Patient ID: Joe [name], male    DOB: March 06, 1996, 27 y.o.   MRN: [id number]  Chief Complaint  Patient presents with   Hypertension    Follow up    HPI: Hypertension: Patient is currently maintained on the following medications for blood pressure: Amlodipine, pt has been getting. readings at home 120-130/70-80  Failed meds include: none Patient reports good compliance with blood pressure medications. Patient denies chest pain, headaches, shortness of breath or swelling.  Assessment & Plan:   Problem List Items Addressed This Visit       Cardiovascular and Mediastinum   Essential hypertension - Primary    chronic amlodipine 10mg  qd, BP elevated today weight down 4lbs, encourage continued wt loss reports polydipsia/polyuria, but also drinking more water adding Losartan 25mg  qd f/u 3 mos      Other Visit Diagnoses     Elevated blood sugar    - elevated fasting, pt also reporting polydipsia, polyuria, A1C 5.3 today. Pt has also switched over all fluids to water, advised normal for increased urination until bladder gets adjusted. Advised no more than 1 gallon of water qd.    Subjective:    Outpatient Medications Prior to Visit  Medication Sig Dispense Refill   hydrOXYzine (ATARAX) 10 MG tablet Take 1-2 tablets (10-20 mg total) by mouth every evening. to unwind, relax before sleep 30 tablet 0   amLODipine (NORVASC) 10 MG tablet Take 1 tablet (10 mg total) by mouth daily. for blood pressure 90 tablet 1   ibuprofen (ADVIL) 200 MG tablet Take 200 mg by mouth every 6 (six) hours as needed.     No facility-administered medications prior to visit.   Past Medical History:  Diagnosis Date   Allergy    BMI 28.0-28.9,adult 01/01/2014   Closed fracture of fifth metacarpal bone of right hand with malunion 09/12/2016   Congenital hammer toe    Saw Dr. Almedia Balls at age 17 yrs. 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  Constitutional:      General: He is not in acute distress.    Appearance: Normal appearance. He is obese.  HENT:     Head: Normocephalic.  Cardiovascular:     Rate and Rhythm: Normal rate and regular rhythm.  Pulmonary:     Effort: Pulmonary effort is normal.     Breath sounds: Normal breath sounds.  Musculoskeletal:        General: Normal range of motion.     Cervical back: Normal range of motion.  Skin:    General: Skin is warm and dry.  Neurological:     Mental Status: He is alert and oriented to person, place, and time.  Psychiatric:        Mood and Affect: Mood normal.    BP (!) 142/85   Pulse (!) 102   Temp (!) 97.3 F (36.3 C) (Temporal)   Ht  6\' 3"  (1.905 m)   Wt (!) 311 lb 2 oz (141.1 kg)   SpO2 97%   BMI 38.89 kg/m  Wt Readings from Last 3 Encounters:  08/08/22 (!) 311 lb 2 oz (141.1 kg)  06/01/22 (!) 315 lb 8 oz (143.1 kg)  05/11/22 (!) 307 lb 6 oz (139.4 kg)       Jeanie Sewer, NP

## 2022-08-08 ENCOUNTER — Encounter: Payer: Self-pay | Admitting: Family

## 2022-08-08 ENCOUNTER — Ambulatory Visit: Payer: BC Managed Care – PPO | Admitting: Family

## 2022-08-08 VITALS — BP 142/85 | HR 102 | Temp 97.3°F | Ht 75.0 in | Wt 311.1 lb

## 2022-08-08 DIAGNOSIS — R739 Hyperglycemia, unspecified: Secondary | ICD-10-CM

## 2022-08-08 DIAGNOSIS — I10 Essential (primary) hypertension: Secondary | ICD-10-CM

## 2022-08-08 LAB — POCT GLYCOSYLATED HEMOGLOBIN (HGB A1C): Hemoglobin A1C: 5.3 % (ref 4.0–5.6)

## 2022-08-08 MED ORDER — AMLODIPINE BESYLATE 10 MG PO TABS: 10.0000 mg | ORAL_TABLET | Freq: Every day | ORAL | 1 refills | Status: DC

## 2022-08-08 MED ORDER — LOSARTAN POTASSIUM 25 MG PO TABS: 25.0000 mg | ORAL_TABLET | Freq: Every day | ORAL | 1 refills | Status: DC

## 2022-08-08 NOTE — Patient Instructions (Addendum)
It was very nice to see you today!   I have sent in your meds to your pharmacy. Schedule a 3 month follow up visit. Keep checking your BP at home & let me know of abnormal readings. Check if your insurance covers weight loss meds (NOT for diabetes): Wegovy, Zepbound, Saxenda, or Ozempic. These are all weekly injectables.  AS we discussed, Magnesium is a good supplement daily for a variety of reasons, follow instructions on the bottle, start with 1 pill then increase as tolerated:  Magnesium Glycinate or L-Threonate (Magtein) - up to 2,000mg /day in divided doses- are easier on the bowels vs. Mag. Citrate or Oxide. They can also help with lowering blood pressure, anxiety, muscle recovery, improved brain function, and if taken before bedtime can help with maintaining your sleep (not falling asleep). Look for chelated form (better absorbability) and either organic or have a 3rd party seal from [employer] international, [employer] or USP. This authenticates the quality but not the efficacy (since not FDA approved).

## 2022-08-08 NOTE — Assessment & Plan Note (Addendum)
chronic amlodipine 10mg  qd, BP elevated today weight down 4lbs, encourage continued wt loss reports polydipsia/polyuria, but also drinking more water adding Losartan 25mg  qd f/u 3 mos

## 2022-10-27 ENCOUNTER — Other Ambulatory Visit (HOSPITAL_COMMUNITY)
Admission: RE | Admit: 2022-10-27 | Discharge: 2022-10-27 | Disposition: A | Discharge status: Home / Self Care | Payer: BC Managed Care – PPO | Source: Ambulatory Visit | Attending: Family | Admitting: Family

## 2022-10-27 ENCOUNTER — Ambulatory Visit (INDEPENDENT_AMBULATORY_CARE_PROVIDER_SITE_OTHER): Payer: BC Managed Care – PPO | Admitting: Family

## 2022-10-27 ENCOUNTER — Encounter: Payer: Self-pay | Admitting: Family

## 2022-10-27 VITALS — BP 148/91 | HR 84 | Temp 97.1°F | Ht 75.0 in | Wt 324.2 lb

## 2022-10-27 DIAGNOSIS — Z113 Encounter for screening for infections with a predominantly sexual mode of transmission: Secondary | ICD-10-CM

## 2022-10-27 NOTE — Progress Notes (Signed)
   Patient ID: Joe [name], male    DOB: 04-29-1996, 27 y.o.   MRN: [id number]  Chief Complaint  Patient presents with   STD Testing     HPI:      STD testing:  pt is in a new relationship and wants to get testing done. Pt has no symptoms, no hx of STDs, last checked about a year ago.      Assessment & Plan:  1. Screen for STD (sexually transmitted disease) -  - Urine cytology ancillary only - HIV antibody (with reflex) - RPR   Subjective:    Outpatient Medications Prior to Visit  Medication Sig Dispense Refill   amLODipine (NORVASC) 10 MG tablet Take 1 tablet (10 mg total) by mouth daily. for blood pressure 90 tablet 1   losartan (COZAAR) 25 MG tablet Take 1 tablet (25 mg total) by mouth daily. 90 tablet 1   hydrOXYzine (ATARAX) 10 MG tablet Take 1-2 tablets (10-20 mg total) by mouth every evening. to unwind, relax before sleep (Patient not taking: Reported on 10/27/2022) 30 tablet 0   No facility-administered medications prior to visit.   Past Medical History:  Diagnosis Date   Allergy    BMI 28.0-28.9,adult 01/01/2014   Closed fracture of fifth metacarpal bone of right hand with malunion 09/12/2016   Congenital hammer toe    Saw Dr. Lunette Stands at age 76 yrs. 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  Constitutional:      General: He is not in acute distress.    Appearance: Normal appearance.  HENT:     Head: Normocephalic.  Cardiovascular:     Rate and Rhythm: Normal rate  and regular rhythm.  Pulmonary:     Effort: Pulmonary effort is normal.     Breath sounds: Normal breath sounds.  Musculoskeletal:        General: Normal range of motion.     Cervical back: Normal range of motion.  Skin:    General: Skin is warm and dry.  Neurological:     Mental Status: He is alert and oriented to person, place, and time.  Psychiatric:        Mood and Affect: Mood normal.    BP (!) 148/91 (BP Location: Left Arm, Patient Position: Sitting, Cuff Size: Large)   Pulse 84   Temp (!) 97.1 F (36.2 C) (Temporal)   Ht 6\' 3"  (1.905 m)   Wt (!) 324 lb 3.2 oz (147.1 kg)   SpO2 96%   BMI 40.52 kg/m  Wt Readings from Last 3 Encounters:  10/27/22 (!) 324 lb 3.2 oz (147.1 kg)  08/08/22 (!) 311 lb 2 oz (141.1 kg)  06/01/22 (!) 315 lb 8 oz (143.1 kg)       Dulce Sellar, NP

## 2022-10-28 LAB — HIV ANTIBODY (ROUTINE TESTING W REFLEX): HIV 1&2 Ab, 4th Generation: NONREACTIVE

## 2022-10-28 LAB — RPR: RPR Ser Ql: NONREACTIVE

## 2022-11-01 LAB — URINE CYTOLOGY ANCILLARY ONLY
Chlamydia: NEGATIVE
Comment: NEGATIVE
Comment: NEGATIVE
Comment: NORMAL
Neisseria Gonorrhea: NEGATIVE
Trichomonas: NEGATIVE

## 2022-11-01 NOTE — Progress Notes (Signed)
can you ask Efraim Kaufmann where the cytology results are please? usually doesn't take this long?

## 2023-02-24 ENCOUNTER — Ambulatory Visit: Payer: BC Managed Care – PPO | Admitting: Family

## 2023-03-01 ENCOUNTER — Ambulatory Visit: Payer: BC Managed Care – PPO | Admitting: Family

## 2023-05-12 ENCOUNTER — Other Ambulatory Visit: Payer: Self-pay | Admitting: Family

## 2023-05-12 DIAGNOSIS — I10 Essential (primary) hypertension: Secondary | ICD-10-CM

## 2023-06-14 ENCOUNTER — Ambulatory Visit: Payer: BC Managed Care – PPO | Admitting: Family

## 2023-06-14 ENCOUNTER — Encounter: Payer: Self-pay | Admitting: Family

## 2023-06-14 VITALS — BP 143/99 | HR 79 | Temp 98.0°F | Ht 75.0 in | Wt 323.4 lb

## 2023-06-14 DIAGNOSIS — I10 Essential (primary) hypertension: Secondary | ICD-10-CM

## 2023-06-14 DIAGNOSIS — E66812 Obesity, class 2: Secondary | ICD-10-CM | POA: Diagnosis not present

## 2023-06-14 DIAGNOSIS — Z113 Encounter for screening for infections with a predominantly sexual mode of transmission: Secondary | ICD-10-CM

## 2023-06-14 DIAGNOSIS — Z6838 Body mass index (BMI) 38.0-38.9, adult: Secondary | ICD-10-CM

## 2023-06-14 MED ORDER — LOSARTAN POTASSIUM 25 MG PO TABS: 25.0000 mg | ORAL_TABLET | Freq: Every day | ORAL | 1 refills | Status: DC

## 2023-06-14 MED ORDER — AMLODIPINE BESYLATE 10 MG PO TABS: 10.0000 mg | ORAL_TABLET | Freq: Every day | ORAL | 1 refills | Status: DC

## 2023-06-14 NOTE — Assessment & Plan Note (Signed)
Elevated blood pressure in office. Patient reports taking Amlodipine daily, but did not take today and is inconsistent with Losartan. -Continue Amlodipine 10mg  and Losartan 25mg  daily. -Sent refills for Amlodipine and Losartan. -OK to continue with current supplement regimen (CoQ10, Magnesium, Beets) as tolerated. -Encouraged to maintain hydration, limit salt intake, and work on exercise and weight loss efforts. -Check blood pressure in 3 months.

## 2023-06-14 NOTE — Progress Notes (Signed)
Patient ID: Joe [name], male    DOB: 11/29/1995, 27 y.o.   MRN: [id number]  Chief Complaint  Patient presents with   STD testing   Hypertension   Discussed the use of AI scribe software for clinical note transcription with the patient, who gave verbal consent to proceed.  History of Present Illness   The patient, with a history of hypertension, presents with a 'weird sensation' and sensitivity at the tip of his genitals. He denies any visible ulcers, redness, or itching. He is concerned about herpes due to his partner's diagnosis of HSV-2, though he denies any intercourse with her yet. He denies any history of cold sores.  The patient's hypertension is managed with amlodipine and losartan; however, he admits to not taking the losartan regularly due to fatigue. He also takes over-the-counter supplements including magnesium and CoQ10 and eating beets for his blood pressure. He reports no adverse effects from the amlodipine.           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 No active lesions or symptoms. Discussed the limitations of blood testing for herpes and the importance of suppression medication by partner and condom use for prevention. No visible lesions or ulcers. Possible yeast infection or irritation from intercourse.  -Apply Lotrimin cream as needed for possible yeast infection. -Consider use of Vaseline for irritation.  Hypertension - Elevated blood pressure in office. Patient reports taking Amlodipine daily, but did not take today and is inconsistent with Losartan. -Continue Amlodipine 10mg  and Losartan 25mg  daily. -Sent refills for Amlodipine and Losartan. -OK to continue with current supplement regimen (CoQ10, Magnesium, Beets) as tolerated. -Encouraged to maintain hydration, limit salt intake, and work on exercise and weight loss efforts. -Check blood pressure in 3 months.     Subjective:    Outpatient Medications  Prior to Visit  Medication Sig Dispense Refill   amLODipine (NORVASC) 10 MG tablet Take 1 tablet (10 mg total) by mouth daily. for blood pressure 90 tablet 1   losartan (COZAAR) 25 MG tablet Take 1 tablet (25 mg total) by mouth daily. 90 tablet 1   hydrOXYzine (ATARAX) 10 MG tablet Take 1-2 tablets (10-20 mg total) by mouth every evening. to unwind, relax before sleep (Patient not taking: Reported on 10/27/2022) 30 tablet 0   No facility-administered medications prior to visit.   Past Medical History:  Diagnosis Date   Allergy    BMI 28.0-28.9,adult 01/01/2014   Closed fracture of fifth metacarpal bone of right hand with malunion 09/12/2016   Congenital hammer toe    Saw Dr. Lunette Stands at age 82 yrs. 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  Constitutional:      General: He is not in acute distress.    Appearance: Normal appearance. He is obese.  HENT:     Head: Normocephalic.  Cardiovascular:  Rate and Rhythm: Normal rate and regular rhythm.  Pulmonary:     Effort: Pulmonary effort is normal.     Breath sounds: Normal breath sounds.  Genitourinary:    Comments: pt refused exam Musculoskeletal:        General: Normal range of motion.     Cervical back: Normal range of motion.  Skin:    General: Skin is warm and dry.  Neurological:     Mental Status: He is alert and oriented to person, place, and time.  Psychiatric:        Mood and Affect: Mood normal.    BP (!) 143/99   Pulse 79    Temp 98 F (36.7 C) (Temporal)   Ht 6\' 3"  (1.905 m)   Wt (!) 323 lb 6.4 oz (146.7 kg)   SpO2 97%   BMI 40.42 kg/m  Wt Readings from Last 3 Encounters:  06/14/23 (!) 323 lb 6.4 oz (146.7 kg)  10/27/22 (!) 324 lb 3.2 oz (147.1 kg)  08/08/22 (!) 311 lb 2 oz (141.1 kg)      Dulce Sellar, NP

## 2023-11-29 ENCOUNTER — Encounter: Payer: Self-pay | Admitting: Family

## 2023-11-29 ENCOUNTER — Ambulatory Visit: Admitting: Family

## 2023-11-29 VITALS — BP 160/101 | HR 65 | Temp 97.9°F | Ht 75.0 in | Wt 333.8 lb

## 2023-11-29 DIAGNOSIS — I10 Essential (primary) hypertension: Secondary | ICD-10-CM | POA: Diagnosis not present

## 2023-11-29 DIAGNOSIS — J329 Chronic sinusitis, unspecified: Secondary | ICD-10-CM | POA: Diagnosis not present

## 2023-11-29 DIAGNOSIS — B9789 Other viral agents as the cause of diseases classified elsewhere: Secondary | ICD-10-CM

## 2023-11-29 MED ORDER — TRIAMCINOLONE ACETONIDE 55 MCG/ACT NA AERO: 1.0000 | INHALATION_SPRAY | Freq: Every day | NASAL | 2 refills | Status: AC

## 2023-11-29 MED ORDER — LOSARTAN POTASSIUM 25 MG PO TABS: 25.0000 mg | ORAL_TABLET | Freq: Every day | ORAL | 1 refills | Status: DC

## 2023-11-29 MED ORDER — NAPROXEN 500 MG PO TABS: 500.0000 mg | ORAL_TABLET | Freq: Two times a day (BID) | ORAL | 0 refills | Status: AC

## 2023-11-29 MED ORDER — AMLODIPINE BESYLATE 10 MG PO TABS: 10.0000 mg | ORAL_TABLET | Freq: Every day | ORAL | 1 refills | Status: DC

## 2023-11-29 MED ORDER — LOSARTAN POTASSIUM 50 MG PO TABS: 50.0000 mg | ORAL_TABLET | Freq: Every day | ORAL | 1 refills | Status: AC

## 2023-11-29 NOTE — Assessment & Plan Note (Signed)
 Chronic hypertension taking losartan  25mg  qd and amlodipine  10mg  qd. BP very high today, even w/not feeling well. Discussed cardiovascular health supplements and lifestyle modifications. - Refill losartan  and amlodipine  prescriptions. Increasing Losartan  to 50mg  qd. - Discuss garlic, fish oil, beets and magnesium supplements for blood pressure management support. - Advise on lifestyle modifications: reduce dietary fat, manage weight, exercise regularly. - F/U in 4 mos.

## 2023-11-29 NOTE — Progress Notes (Signed)
 Patient ID: Joe [name], male    DOB: 1996-01-05, 28 y.o.   MRN: [id number]  Chief Complaint  Patient presents with   Sore Throat    Pt c/o nasal congestion and cough with green mucus, Present since Sunday. Has tried afrin, which did help slightly   Hypertension  Discussed the use of AI scribe software for clinical note transcription with the patient, who gave verbal consent to proceed.  History of Present Illness Joe [name] is a 28 year old male who presents with nasal congestion and sore throat.  He experiences nasal congestion and difficulty breathing through his nose since the weekend, with clear nasal discharge. Afrin nasal spray has been used approximately four times for relief. He felt a sensation of his throat closing, which was severe last night but is currently resolved. There is no fever, chills, or ear pain. He manages symptoms with ice water instead of medications like Advil  or Aleve.  He takes vitamins and supplements, including beetroot, magnesium, fish oil, and turmeric, using turmeric for inflammation. He is aware of magnesium's potential bowel effects. His hypertension is managed with losartan  and amlodipine .  Assessment & Plan Nasal congestion and sore throat Acute viral upper respiratory infection. Discussed Afrin dependency and rebound congestion. Recommended Nasacort  for histamine action. - Prescribe Nasacort  nasal spray: two squirts each nostril twice daily for 2d, then reduce to once daily until symptoms improve. - Recommend OTC naproxen: one or two tablets twice daily as needed for a few days to help with sinus pressure, aches/pains, fever. - Call back if sx are not improved next week.  Hypertension Chronic hypertension taking losartan  25mg  qd and amlodipine  10mg  qd. BP very high today, even w/not feeling well. Discussed cardiovascular health supplements and lifestyle modifications. - Refill losartan  and amlodipine  prescriptions. Increasing Losartan  to  50mg  qd. - Discuss garlic, fish oil, beets and magnesium supplements for blood pressure management support. - Advise on lifestyle modifications: reduce dietary fat, manage weight, exercise regularly. - F/U in 4 mos.   Subjective:     Outpatient Medications Prior to Visit  Medication Sig Dispense Refill   Coenzyme Q10 (COQ10) 100 MG CAPS Take by mouth.     magnesium oxide (MAG-OX) 400 (240 Mg) MG tablet Take 400 mg by mouth daily.     amLODipine  (NORVASC ) 10 MG tablet Take 1 tablet (10 mg total) by mouth daily. for blood pressure 90 tablet 1   losartan  (COZAAR ) 25 MG tablet Take 1 tablet (25 mg total) by mouth daily. 90 tablet 1   No facility-administered medications prior to visit.   Past Medical History:  Diagnosis Date   Allergy    BMI 28.0-28.9,adult 01/01/2014   Closed fracture of fifth metacarpal bone of right hand with malunion 09/12/2016   Congenital hammer toe    Saw Dr. Alix Aquas at age 62 yrs. 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  Constitutional:      General: He is not in acute distress.    Appearance: Normal appearance.  HENT:     Head: Normocephalic.     Right Ear: Tympanic membrane and ear canal normal.     Left Ear: Tympanic membrane and ear canal normal.     Nose: Congestion and rhinorrhea present. Rhinorrhea is clear.     Right Sinus: Frontal sinus tenderness present. No maxillary sinus tenderness.     Left Sinus: Frontal sinus tenderness present. No maxillary  sinus tenderness.     Mouth/Throat:     Mouth: Mucous membranes are moist.     Pharynx: No pharyngeal swelling, oropharyngeal exudate, posterior oropharyngeal erythema or uvula swelling.     Tonsils: No tonsillar exudate or tonsillar abscesses.  Cardiovascular:     Rate and Rhythm: Normal rate and regular rhythm.  Pulmonary:     Effort: Pulmonary effort is normal.     Breath sounds: Normal breath sounds.  Musculoskeletal:        General: Normal range of motion.     Cervical back: Normal range of motion.  Lymphadenopathy:     Head:     Right side of head: No preauricular or posterior auricular adenopathy.     Left side of head: No preauricular or posterior auricular adenopathy.     Cervical: No cervical adenopathy.  Skin:    General: Skin is warm and dry.  Neurological:     Mental Status: He is alert and oriented to person, place, and time.  Psychiatric:        Mood and Affect: Mood normal.    BP (!) 160/101 (BP Location: Left Arm, Patient Position: Sitting, Cuff Size: Large)   Pulse 65   Temp 97.9 F (36.6 C) (Temporal)   Ht 6\' 3"  (1.905 m)   Wt (!) 333 lb 12.8 oz (151.4 kg)   SpO2 98%   BMI 41.72 kg/m  Wt Readings from Last 3 Encounters:  11/29/23 (!) 333 lb 12.8 oz (151.4 kg)  06/14/23 (!) 323 lb 6.4 oz (146.7 kg)  10/27/22 (!) 324 lb 3.2 oz (147.1 kg)      Versa Gore, NP

## 2023-12-11 ENCOUNTER — Other Ambulatory Visit: Payer: Self-pay | Admitting: Family

## 2023-12-11 DIAGNOSIS — B9789 Other viral agents as the cause of diseases classified elsewhere: Secondary | ICD-10-CM

## 2024-03-21 ENCOUNTER — Ambulatory Visit

## 2024-03-28 ENCOUNTER — Ambulatory Visit (INDEPENDENT_AMBULATORY_CARE_PROVIDER_SITE_OTHER)

## 2024-03-28 DIAGNOSIS — Z23 Encounter for immunization: Secondary | ICD-10-CM | POA: Diagnosis not present

## 2024-03-28 NOTE — Progress Notes (Signed)
 Pt came in on the Nurse schedule to receive Tdap vaccine per hudnell. Administered in the Rt deltoid without any complaints.

## 2024-06-03 ENCOUNTER — Encounter: Admitting: Family

## 2024-06-21 ENCOUNTER — Encounter: Admitting: Family

## 2024-07-02 ENCOUNTER — Other Ambulatory Visit: Payer: Self-pay | Admitting: Family

## 2024-07-02 DIAGNOSIS — I10 Essential (primary) hypertension: Secondary | ICD-10-CM

## 2024-07-12 ENCOUNTER — Encounter: Admitting: Family

## 2024-08-03 ENCOUNTER — Other Ambulatory Visit: Payer: Self-pay | Admitting: Family

## 2024-08-09 ENCOUNTER — Encounter: Admitting: Family

## 2024-08-30 ENCOUNTER — Encounter: Admitting: Family
